# Patient Record
Sex: Female | Born: 1965 | Hispanic: Yes | Marital: Married
Health system: Midwestern US, Academic
[De-identification: ages and names within clinical notes are randomized; demographics above are authoritative.]

---

## 2016-06-01 MED ORDER — GABAPENTIN 300 MG PO CAP
300 mg | ORAL_CAPSULE | Freq: Every evening | ORAL | 3 refills | Status: DC
Start: 2016-06-01 — End: 2018-02-16

## 2016-08-31 ENCOUNTER — Encounter: Admit: 2016-08-31 | Discharge: 2016-08-31 | Payer: BC Managed Care – PPO

## 2016-08-31 DIAGNOSIS — Z853 Personal history of malignant neoplasm of breast: ICD-10-CM

## 2016-08-31 DIAGNOSIS — N951 Menopausal and female climacteric states: ICD-10-CM

## 2016-08-31 DIAGNOSIS — R232 Flushing: ICD-10-CM

## 2016-08-31 DIAGNOSIS — T451X5A Adverse effect of antineoplastic and immunosuppressive drugs, initial encounter: ICD-10-CM

## 2016-08-31 DIAGNOSIS — Z9010 Acquired absence of unspecified breast and nipple: ICD-10-CM

## 2016-08-31 DIAGNOSIS — C50211 Malignant neoplasm of upper-inner quadrant of right female breast: Principal | ICD-10-CM

## 2016-08-31 DIAGNOSIS — Z79810 Long term (current) use of selective estrogen receptor modulators (SERMs): ICD-10-CM

## 2016-08-31 DIAGNOSIS — Z87891 Personal history of nicotine dependence: ICD-10-CM

## 2016-08-31 DIAGNOSIS — S93402A Sprain of unspecified ligament of left ankle, initial encounter: ICD-10-CM

## 2016-08-31 DIAGNOSIS — M2560 Stiffness of unspecified joint, not elsewhere classified: ICD-10-CM

## 2016-08-31 DIAGNOSIS — C50919 Malignant neoplasm of unspecified site of unspecified female breast: Principal | ICD-10-CM

## 2016-08-31 MED ORDER — TAMOXIFEN 20 MG PO TAB
20 mg | ORAL_TABLET | Freq: Every day | ORAL | 1 refills | Status: AC
Start: 2016-08-31 — End: 2016-11-30

## 2016-11-30 ENCOUNTER — Encounter: Admit: 2016-11-30 | Discharge: 2016-11-30 | Payer: BC Managed Care – PPO

## 2016-11-30 DIAGNOSIS — Z170 Estrogen receptor positive status [ER+]: ICD-10-CM

## 2016-11-30 DIAGNOSIS — M25572 Pain in left ankle and joints of left foot: ICD-10-CM

## 2016-11-30 DIAGNOSIS — Z853 Personal history of malignant neoplasm of breast: ICD-10-CM

## 2016-11-30 DIAGNOSIS — N951 Menopausal and female climacteric states: ICD-10-CM

## 2016-11-30 DIAGNOSIS — C50911 Malignant neoplasm of unspecified site of right female breast: Principal | ICD-10-CM

## 2016-11-30 DIAGNOSIS — Z87891 Personal history of nicotine dependence: ICD-10-CM

## 2016-11-30 DIAGNOSIS — T451X5A Adverse effect of antineoplastic and immunosuppressive drugs, initial encounter: ICD-10-CM

## 2016-11-30 DIAGNOSIS — R232 Flushing: ICD-10-CM

## 2016-11-30 DIAGNOSIS — C50919 Malignant neoplasm of unspecified site of unspecified female breast: Principal | ICD-10-CM

## 2016-11-30 DIAGNOSIS — Z79810 Long term (current) use of selective estrogen receptor modulators (SERMs): ICD-10-CM

## 2016-11-30 MED ORDER — TAMOXIFEN 20 MG PO TAB
20 mg | ORAL_TABLET | Freq: Every day | ORAL | 1 refills | Status: AC
Start: 2016-11-30 — End: 2017-05-24

## 2016-11-30 NOTE — Progress Notes
Name: Sarah Thornton          MRN: [id number]      DOB: 22-Jan-1966      AGE: 51 y.o.   DATE OF SERVICE: 11/30/2016    Subjective:             Reason for Visit:  Heme/Onc Care      Sarah Thornton is a 51 y.o. female.      History of Present Illness  REFERRING PROVIDER: Dr. Linward Foster  SURGEON: Dr. Yetta Barre  RADIATION ONCOLOGIST: Dr. Murvin Donning    DIAGNOSIS: Invasive ductal carcinoma grade 3, ER and PR positive, HER-2 negative  STAGE: pT3p N2a, stage IIIA  ONCOLOGICAL HISTORY:    Sarah Thornton is a pleasant 51 year old female who was diagnosed with right breast invasive ductal carcinoma 02/07/2014.  She presented due to a palpable right breast lump, imaging studies performed at [facility] showed 2.2 x 2.2 x 1.4 cm suspicious mass at 2 o'clock position.  Multiple cystic areas in the left breast 3:00 and 8 o'clock position were noted which were stable compared to the prior imaging studies.  She then underwent biopsy, pathology was consistent with invasive ductal carcinoma, ER 90%, PR 30% and HER-2 1+, 1 of the lymph nodes biopsied was positive.  She was evaluated by surgery and underwent modified radical mastectomy and lymph node dissection.  Surgical pathology showed invasive ductal carcinoma grade 3 measuring 5.7 x 4.6 x 2.5 cm, 4/9 lymph nodes were involved with extranodal extension, no DCIS.  She was treated by Dr. Rico Ala at [facility] her medical oncologist, she received dose dense AC followed by weekly Taxol ???12 weeks, she tolerated it extremely well.  She subsequently had postmastectomy radiation including regional lymphatics with 60 gray dose.  She was started on adjuvant endocrine therapy with tamoxifen in November 2016.  She stopped having menstrual cycles after first cycle of chemotherapy.  Has been amenorrheic since.  Patient has been tolerating tamoxifen overall well except for hot flashes. She tells me that baseline CT scans were performed after surgery due to stage III disease, but I do not have that information available for review.  She established with me due to change in her insurance plan, and inability to follow with her previous medical oncologist.      TREATMENT HISTORY:   Right MRM with axillary lymph node dissection  Adjuvant chemotherapy with dose dense before AC and Taxol  Postmastectomy radiation  Tamoxifen initiated November 2016  CURRENT THERAPY:   Tamoxifen    INTERVAL HISTORY: Patient is here for follow-up.  Unaccompanied.  Hot flashes are slowly improving.  Overall she is tolerating tamoxifen very well.  She did have injury at workplace, left ankle pain is still not resolved.  She is working with [employer] to see a Careers adviser.  Also noticing occasional pain in the right upper chest wall.         Review of Systems   Constitutional: Negative for activity change, appetite change, chills, diaphoresis and fever.        Hot flashes     HENT: Negative.    Eyes: Negative.    Respiratory: Negative for cough and shortness of breath.    Cardiovascular: Negative for chest pain and palpitations.   Gastrointestinal: Negative for abdominal pain, blood in stool, constipation, diarrhea, nausea and vomiting.   Genitourinary: Negative.    Musculoskeletal: Negative for arthralgias.   Skin: Negative for rash.   Neurological: Negative for dizziness,  weakness, light-headedness, numbness and headaches.   Hematological: Negative.    Psychiatric/Behavioral: Negative.            PAST MEDICAL HISTORY: breast cancer  PAST SURGICAL HISTORY:  Mastectomy, lymph node dissection, colonoscopy  FAMILY HISTORY: Reviewed and is noncontributory.  Mother with history of lymphoma.  No other family history of malignancies.  OB/GYN HISTORY:  Age at Menarche: 26  G2P2  LMP: age  61  HRT use: None  Breastfeeding: No  SOCIAL HISTORY: Patient is married.  Lives with her husband.  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  Former smoker, quit in 2015, 5-pack-year history, no alcohol use, no illicit drug use.      Objective:         ??? gabapentin (NEURONTIN) 300 mg capsule Take 1 capsule by mouth at bedtime daily.   ??? tamoxifen (NOLVADEX) 20 mg tablet Take one tablet by mouth daily.     Vitals:    11/30/16 0812 11/30/16 0818   BP: 118/44    Pulse: 69    Resp: 18    Temp: 36.6 ???C (97.9 ???F)    TempSrc: Oral Oral   SpO2: 98%    Weight: 66.7 kg (147 lb)    Height: 157.5 cm (62)      Body mass index is 26.89 kg/m???.     Pain Score: Zero         Pain Addressed:  N/A    Patient Evaluated for a Clinical Trial: No treatment clinical trial available for this patient.     [place] Cooperative Oncology Group performance status is 1, Restricted in physically strenuous activity but ambulatory and able to carry out work of a light or sedentary nature, e.g., light house work, office work.     Physical Exam   Constitutional: She is oriented to person, place, and time. She appears well-developed and well-nourished. No distress.   HENT:   Mouth/Throat: Oropharynx is clear and moist. No oropharyngeal exudate.   Eyes: Conjunctivae and EOM are normal.   Neck: Neck supple.   Cardiovascular: Normal rate, regular rhythm and normal heart sounds.    No murmur heard.  Pulmonary/Chest: Effort normal and breath sounds normal. She has no wheezes.       Musculoskeletal: Normal range of motion. She exhibits no tenderness.        Lymphadenopathy:     She has no cervical adenopathy.   Neurological: She is alert and oriented to person, place, and time.   Skin: No rash noted.   Psychiatric: She has a normal mood and affect. Her behavior is normal. Judgment and thought content normal.             Assessment and Plan:  1.  History of right breast invasive ductal carcinoma grade 3 ER and PR positive, HER-2 negative.  Tumor measuring more than 5 cm with 4 lymph nodes positive pT3 pN2A, stage IIIa status post mastectomy and lymph node dissection 2.  She has received adjuvant chemotherapy and radiation.  3.  On adjuvant endocrine therapy with tamoxifen since November 2016.  No clinical evidence of recurrence.  Left breast mammogram performed April 2018, was normal.  She has history of left breast cysts which were stable.  4.  Hot flashes ongoing since initiation of tamoxifen, able to improve.  On gabapentin.  5.  Pain in the right upper chest wall.  No clinical evidence of recurrence, based on her history and physical exam this is likely from either scar tissue, frozen  shoulder or tendinopathy.  Supportive measures discussed.  Could consider physical therapy if symptoms do not improve.  6.  With her personal history of breast cancer, discussed genetic counseling.  However she would not meet criteria for testing based on this alone.  Mother with history of lymphoma but otherwise no family history of  Malignancies.   Discussed option of meeting with genetic counselor.  She would like to defer at this point.  7.  Risk of lymphedema with lymph node dissection and radiation, she has no acute issues.  Continue to monitor.  8. Left ankle pain.  Not improved.  She is scheduled to see a Careers adviser.  Have educated her, with increased risk of thromboembolism with tamoxifen.  He does undergo any surgery, she needs to hold tamoxifen 1 week prior to and 2 weeks after her surgery.  Pt will call us with updates.    Return to clinic in 3 months    Parts of this note were created with voice recognition software. Please excuse any grammatical or typographical errors.

## 2017-04-07 ENCOUNTER — Encounter: Admit: 2017-04-07 | Discharge: 2017-04-07 | Payer: BC Managed Care – PPO

## 2017-04-07 DIAGNOSIS — Z87891 Personal history of nicotine dependence: ICD-10-CM

## 2017-04-07 DIAGNOSIS — C50919 Malignant neoplasm of unspecified site of unspecified female breast: Principal | ICD-10-CM

## 2017-04-07 DIAGNOSIS — R232 Flushing: Secondary | ICD-10-CM

## 2017-04-07 DIAGNOSIS — Z853 Personal history of malignant neoplasm of breast: ICD-10-CM

## 2017-04-07 DIAGNOSIS — C50211 Malignant neoplasm of upper-inner quadrant of right female breast: Principal | ICD-10-CM

## 2017-04-07 DIAGNOSIS — T451X5A Adverse effect of antineoplastic and immunosuppressive drugs, initial encounter: ICD-10-CM

## 2017-04-07 DIAGNOSIS — Z170 Estrogen receptor positive status [ER+]: ICD-10-CM

## 2017-05-23 ENCOUNTER — Encounter: Admit: 2017-05-23 | Discharge: 2017-05-23 | Payer: BC Managed Care – PPO

## 2017-05-23 DIAGNOSIS — C50911 Malignant neoplasm of unspecified site of right female breast: ICD-10-CM

## 2017-05-23 DIAGNOSIS — R232 Flushing: Principal | ICD-10-CM

## 2017-05-24 MED ORDER — TAMOXIFEN 20 MG PO TAB
ORAL_TABLET | Freq: Every day | 0 refills | Status: AC
Start: 2017-05-24 — End: 2017-08-16

## 2017-08-09 ENCOUNTER — Encounter: Admit: 2017-08-09 | Discharge: 2017-08-09 | Payer: BC Managed Care – PPO

## 2017-08-15 ENCOUNTER — Encounter: Admit: 2017-08-15 | Discharge: 2017-08-15 | Payer: BC Managed Care – PPO

## 2017-08-16 ENCOUNTER — Encounter: Admit: 2017-08-16 | Discharge: 2017-08-16 | Payer: BC Managed Care – PPO

## 2017-08-16 DIAGNOSIS — R232 Flushing: ICD-10-CM

## 2017-08-16 DIAGNOSIS — C50911 Malignant neoplasm of unspecified site of right female breast: Principal | ICD-10-CM

## 2017-08-16 DIAGNOSIS — Z87891 Personal history of nicotine dependence: ICD-10-CM

## 2017-08-16 DIAGNOSIS — R5382 Chronic fatigue, unspecified: ICD-10-CM

## 2017-08-16 DIAGNOSIS — L603 Nail dystrophy: ICD-10-CM

## 2017-08-16 DIAGNOSIS — Z853 Personal history of malignant neoplasm of breast: ICD-10-CM

## 2017-08-16 DIAGNOSIS — C50919 Malignant neoplasm of unspecified site of unspecified female breast: Principal | ICD-10-CM

## 2017-08-16 DIAGNOSIS — L659 Nonscarring hair loss, unspecified: Secondary | ICD-10-CM

## 2017-08-16 DIAGNOSIS — T451X5A Adverse effect of antineoplastic and immunosuppressive drugs, initial encounter: ICD-10-CM

## 2017-08-16 MED ORDER — TAMOXIFEN 20 MG PO TAB
20 mg | ORAL_TABLET | Freq: Every day | ORAL | 1 refills | Status: AC
Start: 2017-08-16 — End: 2018-02-16

## 2017-08-17 LAB — CBC AND DIFF
Lab: 12 g/dL (ref 12.0–15.0)
Lab: 14 % (ref 11–15)
Lab: 208 10*3/uL (ref 150–400)
Lab: 27 % (ref 24–44)
Lab: 3.9 M/UL — ABNORMAL LOW (ref 4.0–5.0)
Lab: 31 pg (ref 26–34)
Lab: 33 g/dL (ref 32.0–36.0)
Lab: 36 % (ref 36–45)
Lab: 63 % (ref 41–77)
Lab: 7 % (ref 4–12)
Lab: 7.6 10*3/uL (ref 4.5–11.0)
Lab: 9.9 FL (ref 7–11)
Lab: 93 FL (ref 80–100)

## 2017-08-17 LAB — TSH WITH FREE T4 REFLEX: Lab: 1.8 uU/mL — AB (ref 0.35–5.00)

## 2017-08-17 LAB — IRON + BINDING CAPACITY + %SAT+ FERRITIN
Lab: 13 % — ABNORMAL LOW (ref 28–42)
Lab: 402 ug/dL — ABNORMAL HIGH (ref 270–380)
Lab: 49 ng/mL — AB (ref 10–200)
Lab: 53 ug/dL (ref 50–160)

## 2017-08-19 ENCOUNTER — Encounter: Admit: 2017-08-19 | Discharge: 2017-08-19 | Payer: BC Managed Care – PPO

## 2017-09-12 ENCOUNTER — Encounter: Admit: 2017-09-12 | Discharge: 2017-09-13

## 2017-09-23 ENCOUNTER — Encounter: Admit: 2017-09-23 | Discharge: 2017-09-24

## 2018-02-16 ENCOUNTER — Encounter: Admit: 2018-02-16 | Discharge: 2018-02-16 | Payer: BC Managed Care – PPO

## 2018-02-16 DIAGNOSIS — T451X5A Adverse effect of antineoplastic and immunosuppressive drugs, initial encounter: Secondary | ICD-10-CM

## 2018-02-16 DIAGNOSIS — C50911 Malignant neoplasm of unspecified site of right female breast: Secondary | ICD-10-CM

## 2018-02-16 DIAGNOSIS — C50919 Malignant neoplasm of unspecified site of unspecified female breast: Secondary | ICD-10-CM

## 2018-02-16 DIAGNOSIS — R232 Flushing: Secondary | ICD-10-CM

## 2018-02-16 MED ORDER — TAMOXIFEN 20 MG PO TAB
20 mg | ORAL_TABLET | Freq: Every day | ORAL | 1 refills | Status: AC
Start: 2018-02-16 — End: 2018-02-24

## 2018-02-16 MED ORDER — GABAPENTIN 300 MG PO CAP
300 mg | ORAL_CAPSULE | Freq: Every evening | ORAL | 3 refills | Status: AC
Start: 2018-02-16 — End: 2018-07-27

## 2018-02-24 ENCOUNTER — Encounter: Admit: 2018-02-24 | Discharge: 2018-02-24 | Payer: BC Managed Care – PPO

## 2018-02-24 DIAGNOSIS — R232 Flushing: Secondary | ICD-10-CM

## 2018-02-24 DIAGNOSIS — C50911 Malignant neoplasm of unspecified site of right female breast: Secondary | ICD-10-CM

## 2018-02-24 MED ORDER — TAMOXIFEN 20 MG PO TAB
20 mg | ORAL_TABLET | Freq: Every day | ORAL | 1 refills | Status: AC
Start: 2018-02-24 — End: 2018-07-05

## 2018-04-04 ENCOUNTER — Encounter: Admit: 2018-04-04 | Discharge: 2018-04-04 | Payer: BC Managed Care – PPO

## 2018-07-05 ENCOUNTER — Encounter: Admit: 2018-07-05 | Discharge: 2018-07-05

## 2018-07-05 DIAGNOSIS — C50911 Malignant neoplasm of unspecified site of right female breast: Secondary | ICD-10-CM

## 2018-07-05 DIAGNOSIS — R232 Flushing: Secondary | ICD-10-CM

## 2018-07-05 MED ORDER — TAMOXIFEN 20 MG PO TAB
20 mg | ORAL_TABLET | Freq: Every day | ORAL | 1 refills | Status: DC
Start: 2018-07-05 — End: 2019-01-11

## 2018-07-27 ENCOUNTER — Encounter: Admit: 2018-07-27 | Discharge: 2018-07-27

## 2018-07-27 DIAGNOSIS — C50911 Malignant neoplasm of unspecified site of right female breast: Secondary | ICD-10-CM

## 2018-07-27 DIAGNOSIS — R232 Flushing: Secondary | ICD-10-CM

## 2018-07-27 MED ORDER — GABAPENTIN 300 MG PO CAP
ORAL_CAPSULE | Freq: Every evening | 3 refills | Status: DC
Start: 2018-07-27 — End: 2018-12-11

## 2018-08-17 ENCOUNTER — Encounter: Admit: 2018-08-17 | Discharge: 2018-08-17

## 2018-08-17 DIAGNOSIS — C50919 Malignant neoplasm of unspecified site of unspecified female breast: Secondary | ICD-10-CM

## 2018-08-17 DIAGNOSIS — C50911 Malignant neoplasm of unspecified site of right female breast: Principal | ICD-10-CM

## 2018-08-17 DIAGNOSIS — R232 Flushing: Secondary | ICD-10-CM

## 2018-08-17 NOTE — Progress Notes
[facility]  Name: Sarah Thornton          MRN: [id number]      DOB: December 18, 1965      AGE: 53 y.o.   DATE OF SERVICE: 08/17/2018    Subjective:             Reason for Visit:  Heme/Onc Care      Sarah Thornton is a 53 y.o. female.      History of Present Illness  REFERRING PROVIDER: Dr. Linward Foster  SURGEON: Dr. Yetta Barre  RADIATION ONCOLOGIST: Dr. Murvin Donning    DIAGNOSIS: Invasive ductal carcinoma grade 3, ER and PR positive, HER-2 negative  STAGE: pT3p N2a, stage IIIA  ONCOLOGICAL HISTORY:    Sarah Thornton is a pleasant 53 year old female who was diagnosed with right breast invasive ductal carcinoma 02/07/2014.  She presented due to a palpable right breast lump, imaging studies performed at [facility] showed 2.2 x 2.2 x 1.4 cm suspicious mass at 2 o'clock position.  Multiple cystic areas in the left breast 3:00 and 8 o'clock position were noted which were stable compared to the prior imaging studies.  She then underwent biopsy, pathology was consistent with invasive ductal carcinoma, ER 90%, PR 30% and HER-2 1+, 1 of the lymph nodes biopsied was positive.  She was evaluated by surgery and underwent modified radical mastectomy and lymph node dissection.  Surgical pathology showed invasive ductal carcinoma grade 3 measuring 5.7 x 4.6 x 2.5 cm, 4/9 lymph nodes were involved with extranodal extension, no DCIS.  She was treated by Dr. Rico Ala at [facility] her medical oncologist, she received dose dense AC followed by weekly Taxol ???12 weeks, she tolerated it extremely well.  She subsequently had postmastectomy radiation including regional lymphatics with 60 grey dose.  She was started on adjuvant endocrine therapy with tamoxifen in November 2016.  She stopped having menstrual cycles after first cycle of chemotherapy.  Has been amenorrheic since.  Patient has been tolerating tamoxifen overall well except for hot flashes. She tells me that baseline CT scans were performed after surgery due to stage III disease, but I do not have that information available for review.  She established with me due to change in her insurance plan, and inability to follow with her previous medical oncologist.      TREATMENT HISTORY:   Right MRM with axillary lymph node dissection  Adjuvant chemotherapy with dose dense before AC and Taxol  Postmastectomy radiation  Tamoxifen initiated November 2016  CURRENT THERAPY:   Tamoxifen    INTERVAL HISTORY: Patient is here for follow-up.  Unaccompanied.  Hot Flashes are worse due to the weather, is using gabapentin at night.  Recently gabapentin was added twice daily dosing for her back and ankle pain.  Hair thinning is better with biotin.  Unfortunately she had a fall and injured her left ankle and left back in February 2020.  She is getting treated through [employer], ductions were given in the back and she feels her symptoms got worse after that.  Is trying to get a follow-up with the provider.         Review of Systems   Constitutional: Negative for activity change, appetite change, chills, diaphoresis, fatigue and fever.        Hot flashes     HENT: Negative.    Eyes: Negative.    Respiratory: Negative for cough and shortness of breath.    Cardiovascular: Negative for chest pain and palpitations.  Gastrointestinal: Negative for abdominal pain, blood in stool, constipation, diarrhea, nausea and vomiting.   Genitourinary: Negative.    Musculoskeletal: Positive for back pain. Negative for arthralgias.   Skin: Negative for rash.        Hair thinning, brittle nails   Neurological: Negative for dizziness, weakness, light-headedness, numbness and headaches.   Hematological: Negative.    Psychiatric/Behavioral: Negative.            PAST MEDICAL HISTORY: breast cancer  PAST SURGICAL HISTORY:  Mastectomy, lymph node dissection, colonoscopy  FAMILY HISTORY: Reviewed and is noncontributory.  Mother with history of lymphoma.  No other family history of malignancies.  OB/GYN HISTORY:  Age at Menarche: 36  G2P2  LMP: age  61  HRT use: None  Breastfeeding: No  SOCIAL HISTORY: Patient is married.  Lives with her husband.  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  Former smoker, quit in 2015, 5-pack-year history, no alcohol use, no illicit drug use.      Objective:         ??? gabapentin (NEURONTIN) 300 mg capsule TAKE 1 CAPSULE BY MOUTH AT BEDTIME DAILY   ??? tamoxifen (NOLVADEX) 20 mg tablet Take one tablet by mouth daily.     Vitals:    08/17/18 1143   BP: 107/51   BP Source: Arm, Left Upper   Patient Position: Sitting   Pulse: 74   Resp: 18   Temp: 36.6 ???C (97.8 ???F)   TempSrc: Oral   SpO2: 98%   Weight: 68.5 kg (151 lb)   Height: 157.5 cm (62)   PainSc: Zero     Body mass index is 27.62 kg/m???.     Pain Score: Zero         Pain Addressed:  N/A    Patient Evaluated for a Clinical Trial: No treatment clinical trial available for this patient.     [place] Cooperative Oncology Group performance status is 1, Restricted in physically strenuous activity but ambulatory and able to carry out work of a light or sedentary nature, e.g., light house work, office work.     Physical Exam  Constitutional:       General: She is not in acute distress.     Appearance: She is well-developed.   HENT:      Mouth/Throat:      Pharynx: No oropharyngeal exudate.   Eyes:      Conjunctiva/sclera: Conjunctivae normal.   Neck:      Musculoskeletal: Neck supple.   Cardiovascular:      Rate and Rhythm: Normal rate and regular rhythm.      Heart sounds: Normal heart sounds. No murmur.   Pulmonary:      Effort: Pulmonary effort is normal.      Breath sounds: Normal breath sounds. No wheezing.   Chest:       Musculoskeletal: Normal range of motion.         General: No tenderness.      Comments:      Lymphadenopathy:      Cervical: No cervical adenopathy.   Skin:     Findings: No rash.   Neurological: Mental Status: She is alert and oriented to person, place, and time.   Psychiatric:         Behavior: Behavior normal.         Thought Content: Thought content normal.         Judgment: Judgment normal.  Assessment and Plan:  1.  History of right breast invasive ductal carcinoma grade 3 ER and PR positive, HER-2 negative.  Tumor measuring more than 5 cm with 4 lymph nodes positive pT3 pN2A, stage IIIa status post mastectomy and lymph node dissection    She has received adjuvant chemotherapy and radiation.  2..  On adjuvant endocrine therapy with tamoxifen since November 2016.    Discussed prolonged endocrine therapy considering her clinical risk factors, she is postmenopausal.  Continue tamoxifen for 5 years followed by AI.  3. No clinical evidence of recurrence.    Diag mammo of left breast ordered.   4.  Hot flashes, worse likely from the weather.  She is on gabapentin twice daily.  5.  Brittle nails, hair thinning and fatigue. No acute issues  normal TSH.   biotin has helped.   6.  With her personal history of breast cancer, discussed genetic counseling.  However she would not meet criteria for testing based on this alone.  Mother with history of lymphoma but otherwise no family history of  Malignancies.   Discussed option of meeting with genetic counselor.  She would like to defer at this point.  7.  Risk of lymphedema with lymph node dissection and radiation, she has no acute issues.  Continue to monitor.    Return to clinic in 6 months or sooner as needed    Parts of this note were created with voice recognition software. Please excuse any grammatical or typographical errors.

## 2018-08-28 ENCOUNTER — Encounter: Admit: 2018-08-28 | Discharge: 2018-08-28

## 2018-08-28 DIAGNOSIS — C50911 Malignant neoplasm of unspecified site of right female breast: Secondary | ICD-10-CM

## 2018-08-28 DIAGNOSIS — R928 Other abnormal and inconclusive findings on diagnostic imaging of breast: Principal | ICD-10-CM

## 2018-08-28 NOTE — Progress Notes
Received call from [facility] that patient had left breast mammogram done and was abnormal.  Radiologist requesting orders for a cyst aspiration with US guided biopsy.  Orders placed and faxed.

## 2018-08-31 ENCOUNTER — Encounter: Admit: 2018-08-31 | Discharge: 2018-08-31

## 2018-08-31 DIAGNOSIS — R928 Other abnormal and inconclusive findings on diagnostic imaging of breast: Secondary | ICD-10-CM

## 2018-10-05 ENCOUNTER — Encounter: Admit: 2018-10-05 | Discharge: 2018-10-05

## 2018-11-03 ENCOUNTER — Encounter: Admit: 2018-11-03 | Discharge: 2018-11-03 | Payer: BC Managed Care – PPO

## 2018-11-03 NOTE — Telephone Encounter
Pre Visit Planning- New Patient    Records received: Yes    Orders have been NA    Patient active in MyChart. No appointment reminder sent. .     Patient notified of upcoming appointment.    Updated chart: Medication, History and Allergies    MRI PACS

## 2018-11-05 NOTE — Patient Instructions
Thank you for visiting [place] at the [facility] at [facility] of [facility].    Your time is important and if you had to wait today, we do apologize. Our goal is to be prompt; however, on occasion, we experience delays during clinic due to unexpected patient needs. We thank you for your patience.    Today you saw Dr. Mare Ferrari for evaluation and treatment of a worker's compensation injury. Any medication, testing, or treatment discussed during today's visit must be approved by your Work Comp Carrier. Please contact our office at [phone] if you have not been contacted for scheduling of these items within 10 business days.     In the case a potential surgery was discussed, and you wish to proceed, you must inform our office as soon as possible so that we may request written authorization on your behalf.     Please be sure to obtain a copy of your Work Status Form/Letter prior to leaving your visit today. You should expect to receive an updated copy of your Work Status Form/Letter with every appointment you attend related to your worker's compensation injury. Be sure to review this form/letter and ask any questions you may have. Please note that any work restrictions will be updated or changed only during clinical appointments with Dr. Azucena Cecil unless previously discussed with the physician.     We are happy that you came to visit with [place] today, and look forward to working with you.     General Instructions:  ? How to reach me: Please send a MyChart message or leave a voicemail for Madelaine Bhat, RN at [phone].  ? Scheduling: Our scheduling phone number is [phone].  ? How to receive your test results: If you have signed up for MyChart, results are typically viewable within 48 hours. Please note that imaging results will be reviewed during a follow-up visit.  ? How to get a medication refill: Please use MyChart Refill request or contact your pharmacy directly to request medication refills. We require 2 business days notice for refills of controlled substances.  ? Appointment Reminders on your cell phone: Make sure we have your cell phone number, and Text [place] to [phone].  ? Support for many chronic illnesses is available through [employer]: [email or web address] or [phone].  ? For urgent questions on nights, weekends or holidays, call the Operator at [phone], and ask for the doctor on call for Orthopedic Spine Surgery.    Fredric Mare RN, BSN    Clinical Nurse Coordinator ? Dr. Mare Ferrari, M.D.  The [facility]  [place], [place] [address]  Phone: [phone]

## 2018-11-07 ENCOUNTER — Ambulatory Visit: Admit: 2018-11-07 | Discharge: 2018-11-07 | Payer: BC Managed Care – PPO

## 2018-11-07 ENCOUNTER — Encounter: Admit: 2018-11-07 | Discharge: 2018-11-07 | Payer: BC Managed Care – PPO

## 2018-11-07 NOTE — Progress Notes
SPINE CENTER HISTORY AND PHYSICAL    Chief Complaint   Patient presents with   ? Lower Back - Pain, Buttocks pain, Leg Pain   ? Middle Back - Pain           Dictation on: 11/07/2018  3:17 PM by: Lillia Pauls      Dictation on: 11/07/2018  3:36 PM by: Charlesetta Garibaldi     I personally performed the key portions of the E/M visit, discussed case with resident/PA and concur with resident/PA documentation of history, physical exam, assessment, and treatment plan unless otherwise noted.        Medical History:   Diagnosis Date   ? Breast cancer (HCC)    ? Joint pain 2018   ? Other malignant neoplasm without specification of site 03/25/2013    Breast cancer     Surgical History:   Procedure Laterality Date   ? COLONOSCOPY     ? LYMPH NODE BIOPSY     ? PORTACATH PLACEMENT       No Known Allergies    Current Outpatient Medications:   ?  gabapentin (NEURONTIN) 300 mg capsule, TAKE 1 CAPSULE BY MOUTH AT BEDTIME DAILY, Disp: 30 capsule, Rfl: 3  ?  tamoxifen (NOLVADEX) 20 mg tablet, Take one tablet by mouth daily., Disp: 90 tablet, Rfl: 1  family history includes Arthritis-osteo in her mother; Cancer in her mother; Diabetes in her brother, maternal aunt, mother, and sister; Heart Disease in her mother; Stroke in her mother.  Social History     Socioeconomic History   ? Marital status: Married     Spouse name: Not on file   ? Number of children: Not on file   ? Years of education: Not on file   ? Highest education level: Not on file   Occupational History   ? Not on file   Tobacco Use   ? Smoking status: Former Smoker     Packs/day: 0.25     Years: 10.00     Pack years: 2.50     Types: Cigarettes     Last attempt to quit: 06/01/2013     Years since quitting: 5.4   ? Smokeless tobacco: Never Used   ? Tobacco comment: None   Substance and Sexual Activity   ? Alcohol use: No   ? Drug use: No   ? Sexual activity: Yes     Partners: Male     Birth control/protection: None   Other Topics Concern   ? Not on file Social History Narrative   ? Not on file     Review of Systems   Constitutional: Positive for activity change, appetite change, chills and fatigue.   HENT: Negative.    Eyes: Negative.    Respiratory: Negative.    Cardiovascular: Negative.    Gastrointestinal: Negative.    Endocrine: Negative.    Genitourinary: Negative.    Musculoskeletal: Positive for arthralgias, back pain and gait problem.   Skin: Negative.    Allergic/Immunologic: Negative.    Hematological: Negative.    Psychiatric/Behavioral: Negative.      Vitals:    11/07/18 1449   BP: 132/51   Pulse: 99   Temp: 36.8 ?C (98.2 ?F)   SpO2: 98%   Weight: 67.6 kg (149 lb)   Height: 157.5 cm (62)   PainSc: Eight     Oswestry Total Score:: 82  Pain Score: Eight  Body mass index is 27.25 kg/m?Marland Kitchen

## 2018-12-11 ENCOUNTER — Encounter: Admit: 2018-12-11 | Discharge: 2018-12-11 | Payer: BC Managed Care – PPO

## 2018-12-11 DIAGNOSIS — C50911 Malignant neoplasm of unspecified site of right female breast: Secondary | ICD-10-CM

## 2018-12-11 DIAGNOSIS — R232 Flushing: Secondary | ICD-10-CM

## 2018-12-11 MED ORDER — GABAPENTIN 300 MG PO CAP
ORAL_CAPSULE | Freq: Every evening | 3 refills | Status: DC
Start: 2018-12-11 — End: 2019-04-23

## 2018-12-11 NOTE — Patient Instructions
Thank you for visiting [place] at the [facility] at [facility] of [facility].    Your time is important and if you had to wait today, we do apologize. Our goal is to be prompt; however, on occasion, we experience delays during clinic due to unexpected patient needs. We thank you for your patience.    Today you saw Dr. Mare Ferrari for evaluation and treatment of a worker's compensation injury. Any medication, testing, or treatment discussed during today's visit must be approved by your Work Comp Carrier. Please contact our office at [phone] if you have not been contacted for scheduling of these items within 10 business days.     In the case a potential surgery was discussed, and you wish to proceed, you must inform our office as soon as possible so that we may request written authorization on your behalf.     Please be sure to obtain a copy of your Work Status Form/Letter prior to leaving your visit today. You should expect to receive an updated copy of your Work Status Form/Letter with every appointment you attend related to your worker's compensation injury. Be sure to review this form/letter and ask any questions you may have. Please note that any work restrictions will be updated or changed only during clinical appointments with Dr. Azucena Cecil unless previously discussed with the physician.     We are happy that you came to visit with [place] today, and look forward to working with you.     General Instructions:  ? How to reach me: Please send a MyChart message or leave a voicemail for Madelaine Bhat, RN at [phone].  ? Scheduling: Our scheduling phone number is [phone].  ? How to receive your test results: If you have signed up for MyChart, results are typically viewable within 48 hours. Please note that imaging results will be reviewed during a follow-up visit. ? How to get a medication refill: Please use MyChart Refill request or contact your pharmacy directly to request medication refills. We require 2 business days notice for refills of controlled substances.  ? Appointment Reminders on your cell phone: Make sure we have your cell phone number, and Text [place] to [phone].  ? Support for many chronic illnesses is available through [employer]: [email or web address][employer]no or [phone].  ? For urgent questions on nights, weekends or holidays, call the Operator at [phone], and ask for the doctor on call for Orthopedic Spine Surgery.    Fredric Mare RN, BSN    Clinical Nurse Coordinator ? Dr. Mare Ferrari, M.D.  The [facility]  [place], [place] [address]  Phone: [phone]

## 2018-12-19 ENCOUNTER — Ambulatory Visit: Admit: 2018-12-19 | Discharge: 2018-12-20 | Payer: Worker's Compensation

## 2018-12-19 ENCOUNTER — Encounter: Admit: 2018-12-19 | Discharge: 2018-12-19

## 2018-12-19 DIAGNOSIS — M549 Dorsalgia, unspecified: Secondary | ICD-10-CM

## 2018-12-19 DIAGNOSIS — C801 Malignant (primary) neoplasm, unspecified: Secondary | ICD-10-CM

## 2018-12-19 DIAGNOSIS — C50919 Malignant neoplasm of unspecified site of unspecified female breast: Secondary | ICD-10-CM

## 2018-12-19 DIAGNOSIS — M2550 Pain in unspecified joint: Secondary | ICD-10-CM

## 2018-12-19 MED ORDER — CYCLOBENZAPRINE 10 MG PO TAB
10 mg | ORAL_TABLET | Freq: Three times a day (TID) | ORAL | 0 refills | 21.00000 days | Status: AC | PRN
Start: 2018-12-19 — End: ?

## 2018-12-19 NOTE — Progress Notes
[place] NOTE       Obtained patient's, or patient proxy's, verbal consent to treat them and their agreement to [place] and NPP via this telehealth visit during the [facility] Emergency   Dictation on: 12/19/2018  3:34 PM by: Patrcia Dolly [DBURTON]          I   Review of Systems   Constitutional: Negative.  Negative for activity change, appetite change, chills, diaphoresis, fatigue, fever and unexpected weight change.   HENT: Negative.    Eyes: Negative.    Respiratory: Negative.    Cardiovascular: Negative.    Gastrointestinal: Negative.    Endocrine: Negative.    Genitourinary: Negative.    Musculoskeletal: Positive for back pain.   Skin: Negative.    Allergic/Immunologic: Negative.    Neurological: Negative.    Hematological: Negative.    Psychiatric/Behavioral: Negative.      Current Outpatient Medications on File Prior to Visit   Medication Sig Dispense Refill    gabapentin (NEURONTIN) 300 mg capsule TAKE 1 CAPSULE BY MOUTH AT BEDTIME DAILY 30 capsule 3    tamoxifen (NOLVADEX) 20 mg tablet Take one tablet by mouth daily. 90 tablet 1     No current facility-administered medications on file prior to visit.      No Known Allergies  Vitals:    12/19/18 1507   Weight: 67.6 kg (149 lb)   Height: 157.5 cm (62")   PainSc: Nine     Oswestry Total Score:: 76  Pain Score: Nine  Body mass index is 27.25 kg/m.

## 2019-01-02 ENCOUNTER — Encounter: Admit: 2019-01-02 | Discharge: 2019-01-02

## 2019-01-02 ENCOUNTER — Ambulatory Visit: Admit: 2019-01-02 | Discharge: 2019-01-02 | Payer: Worker's Compensation

## 2019-01-02 DIAGNOSIS — M48062 Spinal stenosis, lumbar region with neurogenic claudication: Secondary | ICD-10-CM

## 2019-01-02 DIAGNOSIS — M2550 Pain in unspecified joint: Secondary | ICD-10-CM

## 2019-01-02 DIAGNOSIS — C801 Malignant (primary) neoplasm, unspecified: Secondary | ICD-10-CM

## 2019-01-02 DIAGNOSIS — C50919 Malignant neoplasm of unspecified site of unspecified female breast: Secondary | ICD-10-CM

## 2019-01-02 NOTE — Progress Notes
Per [employer], orders, and referrals from 01/02/2019 visit forwarded via secure e-mail to Varney Baas RN w/ [facility] along with written surgery auth request.

## 2019-01-02 NOTE — Progress Notes
Surgery Scheduling Form: Lumbar Laminectomy    Name: Sarah Thornton MRN: [id number] DOB: 1965-02-26 Sex: female     Patient Type: EXT REC    Procedure: LUMBAR 4-5 LAMINECTOMY    Length: 60 minutes    CPT Codes: R430626    Dx:   LUMBAR STENOSIS WITH NEUROGENIC CLAUDICATION M48.062    Workup Needed: WORK COMP, STANDARD      Positioning: PRONE    Equipment: STANDARD      Procedure, codes, and equipment verified: Yes

## 2019-01-02 NOTE — Patient Instructions
Thank you for visiting [place] at the [facility] at [facility].    Your time is important and if you had to wait today, we do apologize. Our goal is to be prompt; however, on occasion, we experience delays during clinic due to unexpected patient needs. We thank you for your patience.    Today you saw Dr. Mare Ferrari for evaluation and treatment of a worker's compensation injury. Any medication, testing, or treatment discussed during today's visit must be approved by your Work Comp Carrier. Please contact our office at [phone] if you have not been contacted for scheduling of these items within 10 business days. Your Work Comp carrier is responsible for coordinating and scheduling appointments outside the [place] [facility] [employer].     In the case a potential surgery was discussed, and you wish to proceed, you must inform our office as soon as possible so that we may request written authorization on your behalf.     Please be sure to obtain a copy of your Work Status Form/Letter prior to leaving your visit today. You should expect to receive an updated copy of your Work Status Form/Letter with every appointment you attend related to your worker's compensation injury. Be sure to review this form/letter and ask any questions you may have. Please note that any work restrictions will be updated or changed only during clinical appointments with Dr. Azucena Cecil unless previously discussed with the physician.     We are happy that you came to visit with [place] today, and look forward to working with you.     General Instructions:  ? How to reach me: Please send a MyChart message or leave a voicemail for Madelaine Bhat, RN at [phone].  ? Scheduling: Our scheduling phone number is [phone]. ? How to receive your test results: If you have signed up for MyChart, results are typically viewable within 48 hours. Please note that imaging results will be reviewed during a follow-up visit.  ? How to get a medication refill: Please use MyChart Refill request or contact your pharmacy directly to request medication refills. We require 2 business days notice for refills of controlled substances.  ? Appointment Reminders on your cell phone: Make sure we have your cell phone number, and Text [place] to [phone].  ? Support for many chronic illnesses is available through [place]: [employer] or [phone].  ? For urgent questions on nights, weekends or holidays, call the Operator at [phone], and ask for the doctor on call for Orthopedic Spine Surgery.    Fredric Mare RN, BSN    Clinical Nurse Coordinator ? Dr. Mare Ferrari, M.D.  The [facility]  [place], [place] [address]  Phone: [phone]

## 2019-01-11 ENCOUNTER — Encounter: Admit: 2019-01-11 | Discharge: 2019-01-11

## 2019-01-11 DIAGNOSIS — R232 Flushing: Secondary | ICD-10-CM

## 2019-01-11 DIAGNOSIS — C50911 Malignant neoplasm of unspecified site of right female breast: Secondary | ICD-10-CM

## 2019-01-11 MED ORDER — TAMOXIFEN 20 MG PO TAB
ORAL_TABLET | Freq: Every day | 1 refills | Status: DC
Start: 2019-01-11 — End: 2019-07-30

## 2019-01-15 ENCOUNTER — Encounter: Admit: 2019-01-15 | Discharge: 2019-01-15

## 2019-02-13 ENCOUNTER — Encounter: Admit: 2019-02-13 | Discharge: 2019-02-13 | Payer: BC Managed Care – PPO

## 2019-02-15 ENCOUNTER — Encounter: Admit: 2019-02-15 | Discharge: 2019-02-15 | Payer: BC Managed Care – PPO

## 2019-02-15 DIAGNOSIS — M2550 Pain in unspecified joint: Secondary | ICD-10-CM

## 2019-02-15 DIAGNOSIS — C801 Malignant (primary) neoplasm, unspecified: Secondary | ICD-10-CM

## 2019-02-15 DIAGNOSIS — C50919 Malignant neoplasm of unspecified site of unspecified female breast: Secondary | ICD-10-CM

## 2019-02-15 DIAGNOSIS — C50911 Malignant neoplasm of unspecified site of right female breast: Secondary | ICD-10-CM

## 2019-02-15 NOTE — Progress Notes
[facility]  Name: Sarah Thornton          MRN: [id number]      DOB: 07/12/1965      AGE: 54 y.o.   DATE OF SERVICE: 02/15/2019    Subjective:             Reason for Visit:  Heme/Onc Care      Sarah Thornton is a 54 y.o. female.      History of Present Illness  REFERRING PROVIDER: Dr. Linward Foster  SURGEON: Dr. Yetta Barre  RADIATION ONCOLOGIST: Dr. Murvin Donning    DIAGNOSIS: Invasive ductal carcinoma grade 3, ER and PR positive, HER-2 negative  STAGE: pT3p N2a, stage IIIA  ONCOLOGICAL HISTORY:    Sarah Thornton is a pleasant 54 year old female who was diagnosed with right breast invasive ductal carcinoma 02/07/2014.  She presented due to a palpable right breast lump, imaging studies performed at [facility] showed 2.2 x 2.2 x 1.4 cm suspicious mass at 2 o'clock position.  Multiple cystic areas in the left breast 3:00 and 8 o'clock position were noted which were stable compared to the prior imaging studies.  She then underwent biopsy, pathology was consistent with invasive ductal carcinoma, ER 90%, PR 30% and HER-2 1+, 1 of the lymph nodes biopsied was positive.  She was evaluated by surgery and underwent modified radical mastectomy and lymph node dissection.  Surgical pathology showed invasive ductal carcinoma grade 3 measuring 5.7 x 4.6 x 2.5 cm, 4/9 lymph nodes were involved with extranodal extension, no DCIS.  She was treated by Dr. Rico Ala at [facility] her medical oncologist, she received dose dense AC followed by weekly Taxol ?12 weeks, she tolerated it extremely well.  She subsequently had postmastectomy radiation including regional lymphatics with 60 grey dose.  She was started on adjuvant endocrine therapy with tamoxifen in November 2016.  She stopped having menstrual cycles after first cycle of chemotherapy.  Has been amenorrheic since.  Patient has been tolerating tamoxifen overall well except for hot flashes. She tells me that baseline CT scans were performed after surgery due to stage III disease, but I do not have that information available for review.  She established with me due to change in her insurance plan, and inability to follow with her previous medical oncologist.      TREATMENT HISTORY:   Right MRM with axillary lymph node dissection  Adjuvant chemotherapy with dose dense before AC and Taxol  Postmastectomy radiation  Tamoxifen initiated November 2016  CURRENT THERAPY:   Tamoxifen    INTERVAL HISTORY: Patient is seen in follow up, telephone visit  Unfortunately she had a fall and injured her left ankle and left back in February 2020.  Ongoing back pain. Likely will need surgery.   Tolerating tamoxifen well  Denies vaginal bleeding     Obtained patient's verbal consent to treat them and their agreement to [facility] financial policy and NPP via this telehealth visit during the [facility] Emergency           Review of Systems   Constitutional: Negative for activity change, appetite change, chills, diaphoresis, fatigue and fever.             HENT: Negative.    Eyes: Negative.    Respiratory: Negative for cough and shortness of breath.    Cardiovascular: Negative for chest pain and palpitations.   Gastrointestinal: Negative for abdominal pain, blood in stool, constipation, diarrhea, nausea and vomiting.   Genitourinary: Negative.  Musculoskeletal: Positive for back pain. Negative for arthralgias.   Skin: Negative for rash.        Hair thinning, brittle nails   Neurological: Negative for dizziness, weakness, light-headedness, numbness and headaches.   Hematological: Negative.    Psychiatric/Behavioral: Negative.            PAST MEDICAL HISTORY: breast cancer  PAST SURGICAL HISTORY:  Mastectomy, lymph node dissection, colonoscopy  FAMILY HISTORY: Reviewed and is noncontributory.  Mother with history of lymphoma.  No other family history of malignancies.  OB/GYN HISTORY:  Age at Menarche: 30  G2P2 LMP: age  13  HRT use: None  Breastfeeding: No  SOCIAL HISTORY: Patient is married.  Lives with her husband.  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  Former smoker, quit in 2015, 5-pack-year history, no alcohol use, no illicit drug use.      Objective:         ? cyclobenzaprine (FLEXERIL) 10 mg tablet Take one tablet by mouth three times daily as needed for Muscle Cramps.   ? gabapentin (NEURONTIN) 300 mg capsule TAKE 1 CAPSULE BY MOUTH AT BEDTIME DAILY   ? tamoxifen (NOLVADEX) 20 mg tablet TAKE 1 TABLET BY MOUTH EVERY DAY     Vitals:    02/15/19 1025   PainSc: Zero     There is no height or weight on file to calculate BMI.     Pain Score: Zero         Pain Addressed:  N/A    Patient Evaluated for a Clinical Trial: No treatment clinical trial available for this patient.     [place] Cooperative Oncology Group performance status is 1, Restricted in physically strenuous activity but ambulatory and able to carry out work of a light or sedentary nature, e.g., light house work, office work.     Physical Exam  Constitutional:       General: She is not in acute distress.  Neurological:      Mental Status: She is alert and oriented to person, place, and time.   Psychiatric:         Mood and Affect: Mood normal.         Thought Content: Thought content normal.         Judgment: Judgment normal.               Assessment and Plan:  1.  History of right breast invasive ductal carcinoma grade 3 ER and PR positive, HER-2 negative.  Tumor measuring more than 5 cm with 4 lymph nodes positive pT3 pN2A, stage IIIa status post mastectomy and lymph node dissection    She has received adjuvant chemotherapy and radiation.  2..  On adjuvant endocrine therapy with tamoxifen since November 2016.    Discussed prolonged endocrine therapy considering her clinical risk factors, she is postmenopausal.  Continue tamoxifen for 5 years followed by AI. Switch to AI in Nov 2022.   Tolerating tamoxifen well. No vaginal bleeding. 3. Right breast imaging July 2020, showed abnormality c/w cyst.  cyst aspiration July 2020. Needs repeat imaging in July 2021  4.  Hot flashes, no acute issues.  She is on gabapentin twice daily.  5.  Brittle nails, hair thinning and fatigue. normal TSH. On Biotin    biotin has helped.   6.  With her personal history of breast cancer, discussed genetic counseling.  However she would not meet criteria for testing based on this alone.  Mother with history of lymphoma but otherwise no  family history of  Malignancies.   Discussed option of meeting with genetic counselor.  She would like to defer at this point.  7.  Risk of lymphedema with lymph node dissection and radiation, she has no acute issues.  Continue to monitor.  8.  Patient may need surgery for her back in the near future.  I have educated her again about DVT risk factors the tamoxifen, patient will call us if she is going to pursue surgery.  Plan would be to hold tamoxifen 1 week before and 2 weeks after the back surgery.  Postprocedure tamoxifen to be held longer if that is decreased ambulation or complications.    Return to clinic in 6 months or sooner as needed    Parts of this note were created with voice recognition software. Please excuse any grammatical or typographical errors.

## 2019-03-28 ENCOUNTER — Encounter: Admit: 2019-03-28 | Discharge: 2019-03-28 | Payer: BC Managed Care – PPO

## 2019-03-29 ENCOUNTER — Encounter: Admit: 2019-03-29 | Discharge: 2019-03-29 | Payer: BC Managed Care – PPO

## 2019-03-29 NOTE — Telephone Encounter
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  Call to her supervisor Beth placed, request auth for surgery.  Claim will be reviewed, she will call us back to let us know if surgery is approved.

## 2019-04-23 ENCOUNTER — Encounter: Admit: 2019-04-23 | Discharge: 2019-04-23 | Payer: BC Managed Care – PPO

## 2019-04-23 DIAGNOSIS — C50911 Malignant neoplasm of unspecified site of right female breast: Secondary | ICD-10-CM

## 2019-04-23 DIAGNOSIS — R232 Flushing: Secondary | ICD-10-CM

## 2019-04-23 MED ORDER — GABAPENTIN 300 MG PO CAP
ORAL_CAPSULE | Freq: Every day | 3 refills | Status: DC
Start: 2019-04-23 — End: 2019-08-28

## 2019-07-29 ENCOUNTER — Encounter: Admit: 2019-07-29 | Discharge: 2019-07-29 | Payer: BC Managed Care – PPO

## 2019-07-29 DIAGNOSIS — C50911 Malignant neoplasm of unspecified site of right female breast: Secondary | ICD-10-CM

## 2019-07-29 DIAGNOSIS — R232 Flushing: Secondary | ICD-10-CM

## 2019-07-30 MED ORDER — TAMOXIFEN 20 MG PO TAB
ORAL_TABLET | Freq: Every day | 1 refills | Status: DC
Start: 2019-07-30 — End: 2019-08-21

## 2019-08-17 ENCOUNTER — Encounter: Admit: 2019-08-17 | Discharge: 2019-08-17 | Payer: BC Managed Care – PPO

## 2019-08-17 DIAGNOSIS — C50911 Malignant neoplasm of unspecified site of right female breast: Secondary | ICD-10-CM

## 2019-08-21 ENCOUNTER — Encounter: Admit: 2019-08-21 | Discharge: 2019-08-21 | Payer: BC Managed Care – PPO

## 2019-08-21 DIAGNOSIS — M2550 Pain in unspecified joint: Secondary | ICD-10-CM

## 2019-08-21 DIAGNOSIS — C801 Malignant (primary) neoplasm, unspecified: Secondary | ICD-10-CM

## 2019-08-21 DIAGNOSIS — C50919 Malignant neoplasm of unspecified site of unspecified female breast: Secondary | ICD-10-CM

## 2019-08-21 DIAGNOSIS — C50911 Malignant neoplasm of unspecified site of right female breast: Secondary | ICD-10-CM

## 2019-08-21 MED ORDER — ANASTROZOLE 1 MG PO TAB
1 mg | ORAL_TABLET | Freq: Every day | ORAL | 1 refills | 33.00000 days | Status: AC
Start: 2019-08-21 — End: ?

## 2019-08-21 NOTE — Progress Notes
[facility]  Name: Sarah Thornton          MRN: [id number]      DOB: 1965/04/30      AGE: 54 y.o.   DATE OF SERVICE: 08/21/2019    Subjective:             Reason for Visit:  Heme/Onc Care      Sarah Thornton is a 54 y.o. female.      History of Present Illness  REFERRING PROVIDER: Dr. Linward Foster  SURGEON: Dr. Yetta Barre  RADIATION ONCOLOGIST: Dr. Murvin Donning    DIAGNOSIS: Invasive ductal carcinoma grade 3, ER and PR positive, HER-2 negative  STAGE: pT3p N2a, stage IIIA  ONCOLOGICAL HISTORY:    Sarah Thornton is a pleasant 54 year old female who was diagnosed with right breast invasive ductal carcinoma 02/07/2014.  She presented due to a palpable right breast lump, imaging studies performed at [facility] showed 2.2 x 2.2 x 1.4 cm suspicious mass at 2 o'clock position.  Multiple cystic areas in the left breast 3:00 and 8 o'clock position were noted which were stable compared to the prior imaging studies.  She then underwent biopsy, pathology was consistent with invasive ductal carcinoma, ER 90%, PR 30% and HER-2 1+, 1 of the lymph nodes biopsied was positive.  She was evaluated by surgery and underwent modified radical mastectomy and lymph node dissection.  Surgical pathology showed invasive ductal carcinoma grade 3 measuring 5.7 x 4.6 x 2.5 cm, 4/9 lymph nodes were involved with extranodal extension, no DCIS.  She was treated by Dr. Rico Ala at [facility] her medical oncologist, she received dose dense AC followed by weekly Taxol ?12 weeks, she tolerated it extremely well.  She subsequently had postmastectomy radiation including regional lymphatics with 60 grey dose.  She was started on adjuvant endocrine therapy with tamoxifen in November 2016.  She stopped having menstrual cycles after first cycle of chemotherapy.  Has been amenorrheic since.  Patient has been tolerating tamoxifen overall well except for hot flashes.  She tells me that baseline CT scans were performed after surgery due to stage III disease, but I do not have that information available for review.  She established with me due to change in her insurance plan, and inability to follow with her previous medical oncologist.      TREATMENT HISTORY:   Right MRM with axillary lymph node dissection  Adjuvant chemotherapy with dose dense before AC and Taxol  Postmastectomy radiation  Tamoxifen initiated November 2016  CURRENT THERAPY:   Tamoxifen    INTERVAL HISTORY: Patient seen in follow-up.  Unaccompanied for today's visit.  Ongoing back issues.  Has completed consultation with orthopedics Dr. Elige Radon, they are awaiting review and release from [facility] Compensation before she goes through injections versus surgery for her back.  She is also having ongoing issues with her left ankle since her injury.  Tolerating tamoxifen overall well.  No vaginal bleeding.             Review of Systems   Constitutional: Negative for activity change, appetite change, chills, diaphoresis, fatigue and fever.             HENT: Negative.    Eyes: Negative.    Respiratory: Negative for cough and shortness of breath.    Cardiovascular: Negative for chest pain and palpitations.   Gastrointestinal: Negative for abdominal pain, blood in stool, constipation, diarrhea, nausea and vomiting.   Genitourinary: Negative.    Musculoskeletal: Positive for  back pain. Negative for arthralgias.   Skin: Negative for rash.   Neurological: Negative for dizziness, weakness, light-headedness, numbness and headaches.   Hematological: Negative.    Psychiatric/Behavioral: Negative.            PAST MEDICAL HISTORY: breast cancer  PAST SURGICAL HISTORY:  Mastectomy, lymph node dissection, colonoscopy  FAMILY HISTORY: Reviewed and is noncontributory.  Mother with history of lymphoma.  No other family history of malignancies.  OB/GYN HISTORY:  Age at Menarche: 87  G2P2  LMP: age  7  HRT use: None  Breastfeeding: No  SOCIAL HISTORY: Patient is married.  Lives with her husband. 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  Former smoker, quit in 2015, 5-pack-year history, no alcohol use, no illicit drug use.      Objective:         ? cyclobenzaprine (FLEXERIL) 10 mg tablet Take one tablet by mouth three times daily as needed for Muscle Cramps.   ? gabapentin (NEURONTIN) 300 mg capsule TAKE 1 CAPSULE BY MOUTH DAILY AT BEDTIME   ? tamoxifen (NOLVADEX) 20 mg tablet TAKE 1 TABLET BY MOUTH EVERY DAY     Vitals:    08/21/19 1059   BP: 108/52   BP Source: Arm, Left Upper   Patient Position: Sitting   Pulse: 80   Resp: 18   Temp: 36.3 ?C (97.4 ?F)   TempSrc: Temporal   SpO2: 99%   Weight: 67.1 kg (148 lb)   Height: 157.5 cm (62)   PainSc: Zero     Body mass index is 27.07 kg/m?Marland Kitchen     Pain Score: Zero         Pain Addressed:  N/A    Patient Evaluated for a Clinical Trial: No treatment clinical trial available for this patient.     [place] Cooperative Oncology Group performance status is 1, Restricted in physically strenuous activity but ambulatory and able to carry out work of a light or sedentary nature, e.g., light house work, office work.     Physical Exam  Constitutional:       General: She is not in acute distress.  HENT:      Head: Normocephalic.   Eyes:      Extraocular Movements: Extraocular movements intact.      Conjunctiva/sclera: Conjunctivae normal.   Cardiovascular:      Rate and Rhythm: Normal rate and regular rhythm.   Chest:       Musculoskeletal:         General: Normal range of motion.      Right lower leg: No edema.      Left lower leg: No edema.   Neurological:      Mental Status: She is alert and oriented to person, place, and time.   Psychiatric:         Mood and Affect: Mood normal.         Thought Content: Thought content normal.         Judgment: Judgment normal.               Assessment and Plan:  1.  History of right breast invasive ductal carcinoma grade 3 ER and PR positive, HER-2 negative.  Tumor measuring more than 5 cm with 4 lymph nodes positive pT3 pN2A, stage IIIa status post mastectomy and lymph node dissection    She has received adjuvant chemotherapy and radiation.  2..  On adjuvant endocrine therapy with tamoxifen since November 2016.    On her tumor  characteristics, prolonged endocrine therapy recommended.  She is completing 5 years of treatment with tamoxifen by November 2021.  She has 2 more month supply of tamoxifen.  So we decided to switch from tamoxifen to anastrozole after completion of that supply.  Side effects of anastrozole, difference in modality of action compared to tamoxifen also reviewed in detail.  Handouts provided.  We will see how has resolved will impact her back pain.  She has intact uterus, no issues with vaginal bleeding.  3. Right breast imaging July 2020, showed abnormality c/w cyst.  cyst aspiration July 2020.  Repeat has been scheduled.    4.  Hot flashes, no acute issues.  She is on gabapentin twice daily.  5.  Brittle nails, hair thinning and fatigue. normal TSH. On Biotin    biotin has helped.   6.  With her personal history of breast cancer, discussed genetic counseling.  However she would not meet criteria for testing based on this alone.  Mother with history of lymphoma but otherwise no family history of  Malignancies.   Discussed option of meeting with genetic counselor.  She would like to defer at this point.  7.  Risk of lymphedema with lymph node dissection and radiation, she has no acute issues.  Continue to monitor.  8.  Patient may need surgery for her back in the near future.  I have educated her again about DVT risk factors the tamoxifen, patient will call us if she is going to pursue surgery.  Plan would be to hold tamoxifen 1 week before and 2 weeks after the back surgery.  Postprocedure tamoxifen to be held longer if that is decreased ambulation or complications.  If she proceeds with surgery after she switches to AI, no preoperative interruption is needed.    Return to clinic in 4 months or sooner as needed  Parts of this note were created with voice recognition software. Please excuse any grammatical or typographical errors.

## 2019-08-28 ENCOUNTER — Encounter: Admit: 2019-08-28 | Discharge: 2019-08-28 | Payer: BC Managed Care – PPO

## 2019-08-28 DIAGNOSIS — R232 Flushing: Secondary | ICD-10-CM

## 2019-08-28 DIAGNOSIS — C50911 Malignant neoplasm of unspecified site of right female breast: Secondary | ICD-10-CM

## 2019-08-28 MED ORDER — GABAPENTIN 300 MG PO CAP
ORAL_CAPSULE | Freq: Every day | 3 refills | Status: AC
Start: 2019-08-28 — End: ?

## 2019-09-28 ENCOUNTER — Encounter: Admit: 2019-09-28 | Discharge: 2019-09-28 | Payer: BC Managed Care – PPO

## 2019-12-18 ENCOUNTER — Encounter: Admit: 2019-12-18 | Discharge: 2019-12-18 | Payer: BC Managed Care – PPO

## 2019-12-18 DIAGNOSIS — C50919 Malignant neoplasm of unspecified site of unspecified female breast: Secondary | ICD-10-CM

## 2019-12-18 DIAGNOSIS — M2550 Pain in unspecified joint: Secondary | ICD-10-CM

## 2019-12-18 DIAGNOSIS — C801 Malignant (primary) neoplasm, unspecified: Secondary | ICD-10-CM

## 2019-12-18 DIAGNOSIS — Z79811 Long term (current) use of aromatase inhibitors: Secondary | ICD-10-CM

## 2019-12-18 DIAGNOSIS — R232 Flushing: Secondary | ICD-10-CM

## 2019-12-18 DIAGNOSIS — C50911 Malignant neoplasm of unspecified site of right female breast: Secondary | ICD-10-CM

## 2019-12-18 MED ORDER — ANASTROZOLE 1 MG PO TAB
1 mg | ORAL_TABLET | Freq: Every day | ORAL | 1 refills | 33.00000 days | Status: AC
Start: 2019-12-18 — End: ?

## 2020-04-18 ENCOUNTER — Encounter: Admit: 2020-04-18 | Discharge: 2020-04-18 | Payer: BC Managed Care – PPO

## 2020-04-18 DIAGNOSIS — Z79811 Long term (current) use of aromatase inhibitors: Secondary | ICD-10-CM

## 2020-04-18 DIAGNOSIS — C50911 Malignant neoplasm of unspecified site of right female breast: Secondary | ICD-10-CM

## 2020-05-27 ENCOUNTER — Encounter: Admit: 2020-05-27 | Discharge: 2020-05-27 | Payer: BC Managed Care – PPO

## 2020-05-27 DIAGNOSIS — C50919 Malignant neoplasm of unspecified site of unspecified female breast: Secondary | ICD-10-CM

## 2020-05-27 DIAGNOSIS — C801 Malignant (primary) neoplasm, unspecified: Secondary | ICD-10-CM

## 2020-05-27 DIAGNOSIS — M2550 Pain in unspecified joint: Secondary | ICD-10-CM

## 2020-06-16 ENCOUNTER — Encounter: Admit: 2020-06-16 | Discharge: 2020-06-16 | Payer: BC Managed Care – PPO

## 2020-06-16 MED ORDER — ANASTROZOLE 1 MG PO TAB
1 mg | ORAL_TABLET | Freq: Every day | ORAL | 1 refills | 33.00000 days | Status: AC
Start: 2020-06-16 — End: ?

## 2020-09-02 ENCOUNTER — Encounter: Admit: 2020-09-02 | Discharge: 2020-09-02 | Payer: BC Managed Care – PPO

## 2020-09-16 ENCOUNTER — Encounter: Admit: 2020-09-16 | Discharge: 2020-09-16 | Payer: BC Managed Care – PPO

## 2020-09-16 MED ORDER — ANASTROZOLE 1 MG PO TAB
ORAL_TABLET | Freq: Every day | ORAL | 1 refills | 33.00000 days | Status: AC
Start: 2020-09-16 — End: ?

## 2020-12-02 ENCOUNTER — Encounter: Admit: 2020-12-02 | Discharge: 2020-12-02 | Payer: BC Managed Care – PPO

## 2020-12-02 DIAGNOSIS — C50911 Malignant neoplasm of unspecified site of right female breast: Secondary | ICD-10-CM

## 2020-12-02 DIAGNOSIS — M2550 Pain in unspecified joint: Secondary | ICD-10-CM

## 2020-12-02 DIAGNOSIS — Z79811 Long term (current) use of aromatase inhibitors: Secondary | ICD-10-CM

## 2020-12-02 DIAGNOSIS — M85852 Other specified disorders of bone density and structure, left thigh: Secondary | ICD-10-CM

## 2020-12-02 DIAGNOSIS — C801 Malignant (primary) neoplasm, unspecified: Secondary | ICD-10-CM

## 2020-12-02 DIAGNOSIS — C50919 Malignant neoplasm of unspecified site of unspecified female breast: Secondary | ICD-10-CM

## 2020-12-02 MED ORDER — ANASTROZOLE 1 MG PO TAB
1 mg | ORAL_TABLET | Freq: Every day | ORAL | 1 refills | 33.00000 days | Status: AC
Start: 2020-12-02 — End: ?

## 2020-12-02 NOTE — Progress Notes
[facility]  Name: Sarah Thornton          MRN: [id number]      DOB: 1965/08/02      AGE: 55 y.o.   DATE OF SERVICE: 12/02/2020    Subjective:             Reason for Visit:  Heme/Onc Care and Follow Up      Sarah Thornton is a 55 y.o. female.      History of Present Illness  REFERRING PROVIDER: Dr. Linward Foster  SURGEON: Dr. Yetta Barre  RADIATION ONCOLOGIST: Dr. Murvin Donning    DIAGNOSIS: Invasive ductal carcinoma grade 3, ER and PR positive, HER-2 negative  STAGE: pT3p N2a, stage IIIA  ONCOLOGICAL HISTORY:    [name] is a pleasant 55 year old female who was diagnosed with right breast invasive ductal carcinoma 02/07/2014.  She presented due to a palpable right breast lump, imaging studies performed at [facility] showed 2.2 x 2.2 x 1.4 cm suspicious mass at 2 o'clock position.  Multiple cystic areas in the left breast 3:00 and 8 o'clock position were noted which were stable compared to the prior imaging studies.  She then underwent biopsy, pathology was consistent with invasive ductal carcinoma, ER 90%, PR 30% and HER-2 1+, 1 of the lymph nodes biopsied was positive.  She was evaluated by surgery and underwent modified radical mastectomy and lymph node dissection.  Surgical pathology showed invasive ductal carcinoma grade 3 measuring 5.7 x 4.6 x 2.5 cm, 4/9 lymph nodes were involved with extranodal extension, no DCIS.  She was treated by Dr. Rico Ala at [facility] her medical oncologist, she received dose dense AC followed by weekly Taxol ?12 weeks, she tolerated it extremely well.  She subsequently had postmastectomy radiation including regional lymphatics with 60 grey dose.  She was started on adjuvant endocrine therapy with tamoxifen in November 2016.  She stopped having menstrual cycles after first cycle of chemotherapy.  Has been amenorrheic since.  Patient has been tolerating tamoxifen overall well except for hot flashes.  She tells me that baseline CT scans were performed after surgery due to stage III disease, but I do not have that information available for review.  She established with me due to change in her insurance plan, and inability to follow with her previous medical oncologist.      TREATMENT HISTORY:   Right MRM with axillary lymph node dissection  Adjuvant chemotherapy with dose dense before [facility] and Taxol  Postmastectomy radiation  Tamoxifen November 2016 -November 2021  CURRENT THERAPY:   Anastrozole    INTERVAL HISTORY: Sarah Thornton is seen in follow-up.  Unaccompanied for today's visit.  Back pain, left ankle pain have been ongoing.  This is limiting her mobility, impacting quality of life.  She has been doing well on anastrozole otherwise.  Has not seen any worsening of back pain or joint pains with the medication.  No hot flashes.  But she has not been able to exercise as she would like to because of these issues.             Review of Systems   Constitutional: Negative for activity change, appetite change, chills, diaphoresis, fatigue and fever.             HENT: Negative.    Eyes: Negative.    Respiratory: Negative for cough and shortness of breath.    Cardiovascular: Negative for chest pain and palpitations.   Gastrointestinal: Negative for abdominal pain, blood in  stool, constipation, diarrhea, nausea and vomiting.   Genitourinary: Negative.    Musculoskeletal: Positive for back pain. Negative for arthralgias.   Skin: Negative for rash.   Neurological: Negative for dizziness, weakness, light-headedness, numbness and headaches.   Hematological: Negative.    Psychiatric/Behavioral: Negative.            PAST MEDICAL HISTORY: breast cancer  PAST SURGICAL HISTORY:  Mastectomy, lymph node dissection, colonoscopy  FAMILY HISTORY: Reviewed and is noncontributory.  Mother with history of lymphoma.  No other family history of malignancies.  OB/GYN HISTORY:  Age at Menarche: 62  G2P2  LMP: age  32  HRT use: None  Breastfeeding: No  SOCIAL HISTORY: Patient is married. Lives with her husband.  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  Former smoker, quit in 2015, 5-pack-year history, no alcohol use, no illicit drug use.      Objective:         ? anastrozole (ARIMIDEX) 1 mg tablet TAKE 1 TABLET BY MOUTH DAILY   ? cyclobenzaprine (FLEXERIL) 10 mg tablet Take one tablet by mouth three times daily as needed for Muscle Cramps.   ? gabapentin (NEURONTIN) 300 mg capsule TAKE 1 CAPSULE BY MOUTH DAILY AT BEDTIME     Vitals:    12/02/20 1157   BP: 112/55   BP Source: Arm, Left Upper   Pulse: 71   Temp: 36.7 ?C (98.1 ?F)   Resp: 12   SpO2: 99%   TempSrc: Oral   PainSc: Zero   Weight: 61.7 kg (136 lb)   Height: 159 cm (5' 2.6)     Body mass index is 24.4 kg/m?Marland Kitchen     Pain Score: Zero         Pain Addressed:  N/A    Patient Evaluated for a Clinical Trial: No treatment clinical trial available for this patient.     [place] Cooperative Oncology Group performance status is 1, Restricted in physically strenuous activity but ambulatory and able to carry out work of a light or sedentary nature, e.g., light house work, office work.     Physical Exam  Constitutional:       General: She is not in acute distress.  HENT:      Head: Normocephalic.   Eyes:      Extraocular Movements: Extraocular movements intact.      Conjunctiva/sclera: Conjunctivae normal.   Cardiovascular:      Rate and Rhythm: Normal rate and regular rhythm.   Chest:       Musculoskeletal:         General: Normal range of motion.      Right lower leg: No edema.      Left lower leg: No edema.   Neurological:      Mental Status: She is alert and oriented to person, place, and time.   Psychiatric:         Mood and Affect: Mood normal.         Thought Content: Thought content normal.         Judgment: Judgment normal.               Assessment and Plan:  1.  History of right breast invasive ductal carcinoma grade 3 ER and PR positive, HER-2 negative.  Tumor measuring more than 5 cm with 4 lymph nodes positive pT3 pN2A, stage IIIa status post mastectomy and lymph node dissection  She has received adjuvant chemotherapy and radiation.  2. On adjuvant endocrine therapy since November 2016.  Tamoxifen Nov 2016 - Nov 2021.  Based on her tumor characteristics, prolonged endocrine therapy recommended.  Plan for additional 2 to 5 years based on tolerability.  On Anastrozole since July 2021.  Tolerating well.  3.  09/01/2020 left breast mammogram BI-RADS 1  4.  Hot flashes, no acute issues.  She is on gabapentin twice daily.  5.  Brittle nails, hair thinning and fatigue. normal TSH. On Biotin   Continue biotin, use scalp massages.  Hair thinning likely from endocrine therapy.   6.  With her personal history of breast cancer, discussed genetic counseling.  However she would not meet criteria for testing based on this alone.  Mother with history of lymphoma but otherwise no family history of  Malignancies.   Discussed option of meeting with genetic counselor.  She would like to defer at this point.  7.  Risk of lymphedema with lymph node dissection and radiation, she has no acute issues.  Continue to monitor.  8.   Bone health: No known history of osteoporosis.    Baseline BMD March 2022 at [facility] with least T score of -2.3 in the femur.  -1.8 in the lumbar spine.  We discussed management options. With intact uterus, previous prolonged tamoxifen use, she was not taking calcium and vitamin D supplementation ( She started ca/vit D after BMD March 2022.) shared decision made to continue anastrozole and repeat bone density in 1 year that is March 2023.    Limited weightbearing exercises due to back pain and left ankle pain.  Orders placed for repeat testing.  Patient is aware that this could be self-pay as it is in 1 year.  But she will check with her insurance first.    Return to clinic March 2023 with bone density  Parts of this note were created with voice recognition software. Please excuse any grammatical or typographical errors.

## 2021-04-07 ENCOUNTER — Encounter: Admit: 2021-04-07 | Discharge: 2021-04-07 | Payer: BC Managed Care – PPO

## 2021-04-07 DIAGNOSIS — Z79811 Long term (current) use of aromatase inhibitors: Secondary | ICD-10-CM

## 2021-04-07 DIAGNOSIS — M2550 Pain in unspecified joint: Secondary | ICD-10-CM

## 2021-04-07 DIAGNOSIS — C801 Malignant (primary) neoplasm, unspecified: Secondary | ICD-10-CM

## 2021-04-07 DIAGNOSIS — C50919 Malignant neoplasm of unspecified site of unspecified female breast: Secondary | ICD-10-CM

## 2021-04-07 DIAGNOSIS — C50911 Malignant neoplasm of unspecified site of right female breast: Secondary | ICD-10-CM

## 2021-04-07 NOTE — Progress Notes
[facility]  Name: Sarah Thornton          MRN: [id number]      DOB: [birth year]      AGE: 56 y.o.   DATE OF SERVICE: 04/07/2021    Subjective:             Reason for Visit:  Heme/Onc Care and Follow Up      Sarah Thornton is a 56 y.o. female.      History of Present Illness  REFERRING PROVIDER: Dr. Linward Foster  SURGEON: Dr. Yetta Barre  RADIATION ONCOLOGIST: Dr. Murvin Donning    DIAGNOSIS: Invasive ductal carcinoma grade 3, ER and PR positive, HER-2 negative  STAGE: pT3p N2a, stage IIIA  ONCOLOGICAL HISTORY:    Sarah Thornton is a pleasant 56 year old female who was diagnosed with right breast invasive ductal carcinoma 02/07/2014.  She presented due to a palpable right breast lump, imaging studies performed at [facility] showed 2.2 x 2.2 x 1.4 cm suspicious mass at 2 o'clock position.  Multiple cystic areas in the left breast 3:00 and 8 o'clock position were noted which were stable compared to the prior imaging studies.  She then underwent biopsy, pathology was consistent with invasive ductal carcinoma, ER 90%, PR 30% and HER-2 1+, 1 of the lymph nodes biopsied was positive.  She was evaluated by surgery and underwent modified radical mastectomy and lymph node dissection.  Surgical pathology showed invasive ductal carcinoma grade 3 measuring 5.7 x 4.6 x 2.5 cm, 4/9 lymph nodes were involved with extranodal extension, no DCIS.  She was treated by Dr. Rico Ala at [facility] her medical oncologist, she received dose dense AC followed by weekly Taxol ?12 weeks, she tolerated it extremely well.  She subsequently had postmastectomy radiation including regional lymphatics with 60 grey dose.  She was started on adjuvant endocrine therapy with tamoxifen in November 2016.  She stopped having menstrual cycles after first cycle of chemotherapy.  Has been amenorrheic since.  Patient has been tolerating tamoxifen overall well except for hot flashes.  She tells me that baseline CT scans were performed after surgery due to stage III disease, but I do not have that information available for review.  She established with me due to change in her insurance plan, and inability to follow with her previous medical oncologist.      TREATMENT HISTORY:   Right MRM with axillary lymph node dissection  Adjuvant chemotherapy with dose dense before [facility] and Taxol  Postmastectomy radiation  Tamoxifen November 2016 -November 2021  CURRENT THERAPY:   Anastrozole    INTERVAL HISTORY: Sarah Thornton is seen in follow-up.  Unaccompanied for today's visit.  She reports significant pain in her bilateral hips.  She has stiffness and difficulty when she is getting up, symptoms initially improved but then get worse after walking prolonged.  Of time.  It has been very uncomfortable.  She is struggling to maintain her quality of life.  She is not sure if it is related to her ankle, back issues or the medication.  She has been working with [employer] but has not been able to go through any surgeries yet.  All of the above is causing situational depression.           Review of Systems   Constitutional: Negative for activity change, appetite change, chills, diaphoresis, fatigue and fever.             HENT: Negative.    Eyes: Negative.  Respiratory: Negative for cough and shortness of breath.    Cardiovascular: Negative for chest pain and palpitations.   Gastrointestinal: Negative for abdominal pain, blood in stool, constipation, diarrhea, nausea and vomiting.   Genitourinary: Negative.    Musculoskeletal: Positive for arthralgias and back pain.   Skin: Negative for rash.   Neurological: Negative for dizziness, weakness, light-headedness, numbness and headaches.   Hematological: Negative.    Psychiatric/Behavioral: Negative.            PAST MEDICAL HISTORY: breast cancer  PAST SURGICAL HISTORY:  Mastectomy, lymph node dissection, colonoscopy  FAMILY HISTORY: Reviewed and is noncontributory.  Mother with history of lymphoma.  No other family history of malignancies.  OB/GYN HISTORY:  Age at Menarche: 65  G2P2  LMP: age  19  HRT use: None  Breastfeeding: No  SOCIAL HISTORY: Patient is married.  Lives with her husband.  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  Former smoker, quit in 2015, 5-pack-year history, no alcohol use, no illicit drug use.      Objective:         ? anastrozole (ARIMIDEX) 1 mg tablet Take one tablet by mouth daily.   ? cyclobenzaprine (FLEXERIL) 10 mg tablet Take one tablet by mouth three times daily as needed for Muscle Cramps.   ? gabapentin (NEURONTIN) 300 mg capsule TAKE 1 CAPSULE BY MOUTH DAILY AT BEDTIME     Vitals:    04/07/21 1103   BP: 125/47   BP Source: Arm, Left Upper   Pulse: 58   Temp: 36.4 ?C (97.6 ?F)   Resp: 10   SpO2: 100%   TempSrc: Oral   PainSc: Seven   Weight: 60 kg (132 lb 3.2 oz)   Height: 159 cm (5' 2.6)     Body mass index is 23.72 kg/m?Marland Kitchen     Pain Score: Seven  Pain Loc: Back (ankle)      Pain Addressed:  N/A    Patient Evaluated for a Clinical Trial: No treatment clinical trial available for this patient.     [place] Cooperative Oncology Group performance status is 1, Restricted in physically strenuous activity but ambulatory and able to carry out work of a light or sedentary nature, e.g., light house work, office work.     Physical Exam  Constitutional:       General: She is not in acute distress.  HENT:      Head: Normocephalic.   Eyes:      Extraocular Movements: Extraocular movements intact.      Conjunctiva/sclera: Conjunctivae normal.   Cardiovascular:      Rate and Rhythm: Normal rate and regular rhythm.   Musculoskeletal:         General: Tenderness (bilateral hip joint tenderness) present.      Right lower leg: No edema.      Left lower leg: No edema.   Neurological:      Mental Status: She is alert and oriented to person, place, and time.   Psychiatric:         Mood and Affect: Mood normal.         Thought Content: Thought content normal.         Judgment: Judgment normal. Assessment and Plan:  1.  History of right breast invasive ductal carcinoma grade 3 ER and PR positive, HER-2 negative.  Tumor measuring more than 5 cm with 4 lymph nodes positive pT3 pN2A, stage IIIa status post mastectomy and lymph node dissection  She has received adjuvant  chemotherapy and radiation.    2. On adjuvant endocrine therapy since November 2016.    Tamoxifen Nov 2016 - Nov 2021.  Based on her tumor characteristics, prolonged endocrine therapy recommended.  Plan for additional 2 to 5 years based on tolerability.  On Anastrozole since July 2021.    Bilateral hip pain.  Could be related to underlying arthritis versus her chronic back issues versus anastrozole.  She will take 2-week break.  Based on her response and repeat bone density results we will decide on further treatment.  Options would be exemestane or tamoxifen.    3.  09/01/2020 left breast mammogram BI-RADS 1    4.  Hot flashes, no acute issues.  She is on gabapentin twice daily.    5.  Brittle nails, hair thinning and fatigue. normal TSH. On Biotin   Continue biotin, use scalp massages.  Hair thinning likely from endocrine therapy.     6.  With her personal history of breast cancer, discussed genetic counseling.  However she would not meet criteria for testing based on this alone.  Mother with history of lymphoma but otherwise no family history of  Malignancies.   Discussed option of meeting with genetic counselor.  She would like to defer at this point.    7.  Risk of lymphedema with lymph node dissection and radiation, she has no acute issues.  Continue to monitor.    8.   Bone health: No known history of osteoporosis.    Baseline BMD March 2022 at [facility] with least T score of -2.3 in the femur.  -1.8 in the lumbar spine.  We discussed management options. With intact uterus, previous prolonged tamoxifen use, she was not taking calcium and vitamin D supplementation ( She started ca/vit D after BMD March 2022.) shared decision made to continue anastrozole and repeat bone density in 1 year that is March 2023.    Limited weightbearing exercises due to back pain and left ankle pain.  Repeat bone density scheduled for end of March 2023.    Will review bone density results, discuss further management based on her response with treatment break from Anastrozole.    Return to clinic in 4 months or sooner as needed.  Parts of this note were created with voice recognition software. Please excuse any grammatical or typographical errors.

## 2021-04-20 ENCOUNTER — Encounter: Admit: 2021-04-20 | Discharge: 2021-04-20 | Payer: BC Managed Care – PPO

## 2021-04-20 DIAGNOSIS — C50911 Malignant neoplasm of unspecified site of right female breast: Secondary | ICD-10-CM

## 2021-04-20 MED ORDER — ALENDRONATE 70 MG PO TAB
70 mg | ORAL_TABLET | ORAL | 1 refills | Status: AC
Start: 2021-04-20 — End: ?

## 2021-04-20 NOTE — Progress Notes
Bone density March 2023: Progression with development of osteoporosis.  Left femur -2.9 T score.  Lumbar spine -2.4 compared to -1.8 in 2022.    Reviewed results with her.    We discussed management options.    Continue Anastrozole and initiate Fosamax.  Patient had completed a few dental procedures recently.  She believes she has filling of one of the teeth left.  She will discuss with her dentist and initiate Fosamax only after she obtains clearance from her dentist.  Side effects from Fosamax including but not limited to ONJ discussed with her in great detail.  All her questions and concerns were addressed.  Patient verbalized understanding and agreeable with the plan.

## 2021-05-30 ENCOUNTER — Encounter: Admit: 2021-05-30 | Discharge: 2021-05-30 | Payer: BC Managed Care – PPO

## 2021-05-30 MED ORDER — ANASTROZOLE 1 MG PO TAB
ORAL_TABLET | 1 refills
Start: 2021-05-30 — End: ?

## 2021-07-10 ENCOUNTER — Encounter: Admit: 2021-07-10 | Discharge: 2021-07-10 | Payer: BC Managed Care – PPO

## 2021-07-14 ENCOUNTER — Encounter: Admit: 2021-07-14 | Discharge: 2021-07-14 | Payer: BC Managed Care – PPO

## 2021-07-14 DIAGNOSIS — M810 Age-related osteoporosis without current pathological fracture: Secondary | ICD-10-CM

## 2021-07-14 DIAGNOSIS — C50919 Malignant neoplasm of unspecified site of unspecified female breast: Secondary | ICD-10-CM

## 2021-07-14 DIAGNOSIS — M2550 Pain in unspecified joint: Secondary | ICD-10-CM

## 2021-07-14 DIAGNOSIS — C50911 Malignant neoplasm of unspecified site of right female breast: Secondary | ICD-10-CM

## 2021-07-14 DIAGNOSIS — Z79811 Long term (current) use of aromatase inhibitors: Secondary | ICD-10-CM

## 2021-07-14 DIAGNOSIS — C801 Malignant (primary) neoplasm, unspecified: Secondary | ICD-10-CM

## 2021-07-14 NOTE — Progress Notes
Cbc  Name: Sarah Thornton          MRN: [id number]      DOB: [birth year]      AGE: 56 y.o.   DATE OF SERVICE: 07/14/2021    Subjective:             Reason for Visit:  Heme/Onc Care      Sarah Thornton is a 56 y.o. female.      History of Present Illness  REFERRING PROVIDER: Dr. Linward Foster  SURGEON: Dr. Yetta Barre  RADIATION ONCOLOGIST: Dr. Murvin Donning    DIAGNOSIS: Invasive ductal carcinoma grade 3, ER and PR positive, HER-2 negative  STAGE: pT3p N2a, stage IIIA  ONCOLOGICAL HISTORY:    Sarah Thornton is a pleasant 56 year old female who was diagnosed with right breast invasive ductal carcinoma 02/07/2014.  She presented due to a palpable right breast lump, imaging studies performed at [facility] showed 2.2 x 2.2 x 1.4 cm suspicious mass at 2 o'clock position.  Multiple cystic areas in the left breast 3:00 and 8 o'clock position were noted which were stable compared to the prior imaging studies.  She then underwent biopsy, pathology was consistent with invasive ductal carcinoma, ER 90%, PR 30% and HER-2 1+, 1 of the lymph nodes biopsied was positive.  She was evaluated by surgery and underwent modified radical mastectomy and lymph node dissection.  Surgical pathology showed invasive ductal carcinoma grade 3 measuring 5.7 x 4.6 x 2.5 cm, 4/9 lymph nodes were involved with extranodal extension, no DCIS.  She was treated by Dr. Rico Ala at [facility] her medical oncologist, she received dose dense AC followed by weekly Taxol ?12 weeks, she tolerated it extremely well.  She subsequently had postmastectomy radiation including regional lymphatics with 60 grey dose.  She was started on adjuvant endocrine therapy with tamoxifen in November 2016.  She stopped having menstrual cycles after first cycle of chemotherapy.  Has been amenorrheic since.  Patient has been tolerating tamoxifen overall well except for hot flashes.  She tells me that baseline CT scans were performed after surgery due to stage III disease, but I do not have that information available for review.  She established with me due to change in her insurance plan, and inability to follow with her previous medical oncologist.      TREATMENT HISTORY:   Right MRM with axillary lymph node dissection  Adjuvant chemotherapy with dose dense before [facility] and Taxol  Postmastectomy radiation  Tamoxifen November 2016 -November 2021  CURRENT THERAPY:   Anastrozole November 2021    INTERVAL HISTORY: Sarah Thornton is seen in follow-up.  Unaccompanied for today's visit.  Going back pain and bilateral hip pain which is impacting her quality of life.  Also impacting her emotionally.  She did not see a huge change with 2-week break.  Has completed her dental work-up and will be starting her Fosamax.  Ankle brace in place.  Based on response with supportive care today will be decision made regarding need for surgery.           Review of Systems   Constitutional: Negative for activity change, appetite change, chills, diaphoresis, fatigue and fever.             HENT: Negative.    Eyes: Negative.    Respiratory: Negative for cough and shortness of breath.    Cardiovascular: Negative for chest pain and palpitations.   Gastrointestinal: Negative for abdominal pain, blood in stool, constipation, diarrhea, nausea  and vomiting.   Genitourinary: Negative.    Musculoskeletal: Positive for arthralgias and back pain.   Skin: Negative for rash.   Neurological: Negative for dizziness, weakness, light-headedness, numbness and headaches.   Hematological: Negative.    Psychiatric/Behavioral: Negative.            PAST MEDICAL HISTORY: breast cancer  PAST SURGICAL HISTORY:  Mastectomy, lymph node dissection, colonoscopy  FAMILY HISTORY: Reviewed and is noncontributory.  Mother with history of lymphoma.  No other family history of malignancies.  OB/GYN HISTORY:  Age at Menarche: 9  G2P2  LMP: age  80  HRT use: None  Breastfeeding: No  SOCIAL HISTORY: Patient is married. Lives with her husband.  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  Former smoker, quit in 2015, 5-pack-year history, no alcohol use, no illicit drug use.      Objective:         ? alendronate (FOSAMAX) 70 mg tablet Take one tablet by mouth every 7 days. Take at least 30 minutes before breakfast with plain water. Do not lie down for 30 minutes.   ? anastrozole (ARIMIDEX) 1 mg tablet TAKE 1 TABLET BY MOUTH DAILY   ? cyclobenzaprine (FLEXERIL) 10 mg tablet Take one tablet by mouth three times daily as needed for Muscle Cramps.   ? gabapentin (NEURONTIN) 300 mg capsule TAKE 1 CAPSULE BY MOUTH DAILY AT BEDTIME     Vitals:    07/14/21 1059   BP: 128/38   BP Source: Arm, Left Upper   Pulse: 72   Temp: 36.7 ?C (98 ?F)   Resp: 18   SpO2: 100%   TempSrc: Oral   PainSc: Seven   Weight: 58.8 kg (129 lb 9.6 oz)     Body mass index is 23.25 kg/m?Marland Kitchen     Pain Score: Seven  Pain Loc: Generalized      Pain Addressed:  N/A    Patient Evaluated for a Clinical Trial: No treatment clinical trial available for this patient.     [place] Cooperative Oncology Group performance status is 1, Restricted in physically strenuous activity but ambulatory and able to carry out work of a light or sedentary nature, e.g., light house work, office work.     Physical Exam  Constitutional:       General: She is not in acute distress.  HENT:      Head: Normocephalic.   Eyes:      Extraocular Movements: Extraocular movements intact.      Conjunctiva/sclera: Conjunctivae normal.   Cardiovascular:      Rate and Rhythm: Normal rate and regular rhythm.   Musculoskeletal:         General: Tenderness (bilateral hip joint tenderness) present.      Right lower leg: No edema.      Left lower leg: No edema.   Neurological:      Mental Status: She is alert and oriented to person, place, and time.   Psychiatric:         Mood and Affect: Mood normal.         Thought Content: Thought content normal.         Judgment: Judgment normal.               Assessment and Plan:  1. History of right breast invasive ductal carcinoma grade 3 ER and PR positive, HER-2 negative.  Tumor measuring more than 5 cm with 4 lymph nodes positive pT3 pN2A, stage IIIa status post mastectomy and lymph node  dissection  She has received adjuvant chemotherapy and radiation.    2. On adjuvant endocrine therapy since November 2016.    Tamoxifen Nov 2016 - Nov 2021.  Based on her tumor characteristics, prolonged endocrine therapy recommended.  Plan for additional 2 to 5 years based on tolerability.  On Anastrozole since July 2021.    Bilateral hip pain.  Could be related to underlying arthritis versus her chronic back issues versus anastrozole.  Discussed further options, plan is for a longer 6-week break.  Based on response will decide on continuing endocrine therapy, switch to exemestane or tamoxifen versus complete discontinuation.  Does not remember having significant change in symptoms with tamoxifen.    3.  09/01/2020 left breast mammogram BI-RADS 1    4.  Hot flashes, no acute issues.  She is on gabapentin twice daily.    5.  Brittle nails, hair thinning and fatigue. normal TSH. On Biotin   Continue biotin, use scalp massages.  Hair thinning likely from endocrine therapy.     6.  With her personal history of breast cancer, discussed genetic counseling.  However she would not meet criteria for testing based on this alone.  Mother with history of lymphoma but otherwise no family history of  Malignancies.   Discussed option of meeting with genetic counselor.  She would like to defer at this point.    7.  Risk of lymphedema with lymph node dissection and radiation, she has no acute issues.  Continue to monitor.    8.   Bone health:   Baseline BMD March 2022 at [facility] with least T score of -2.3 in the femur.  -1.8 in the lumbar spine.  With intact uterus, previous prolonged tamoxifen use, she was not taking calcium and vitamin D supplementation ( She started ca/vit D after BMD March 2022.) shared decision made to continue anastrozole and repeat bone density in 1 year that is March 2023.    Bone density March 2023: Progression with development of osteoporosis.  Left femur -2.9 T score.  Lumbar spine -2.4 compared to -1.8 in 2022.  Initiated Fosamax.  Was unable to start secondary to dental procedures.  She is going to start the medication now.    RTC in 6 weeks   Parts of this note were created with voice recognition software. Please excuse any grammatical or typographical errors.

## 2021-09-15 ENCOUNTER — Encounter: Admit: 2021-09-15 | Discharge: 2021-09-15 | Payer: BC Managed Care – PPO

## 2021-10-05 ENCOUNTER — Encounter: Admit: 2021-10-05 | Discharge: 2021-10-05 | Payer: BC Managed Care – PPO

## 2021-10-05 MED ORDER — ALENDRONATE 70 MG PO TAB
70 mg | ORAL_TABLET | ORAL | 1 refills
Start: 2021-10-05 — End: ?

## 2021-11-29 ENCOUNTER — Encounter: Admit: 2021-11-29 | Discharge: 2021-11-29 | Payer: BC Managed Care – PPO

## 2021-11-29 MED ORDER — ANASTROZOLE 1 MG PO TAB
ORAL_TABLET | 1 refills
Start: 2021-11-29 — End: ?

## 2021-12-01 ENCOUNTER — Encounter: Admit: 2021-12-01 | Discharge: 2021-12-01 | Payer: BC Managed Care – PPO

## 2021-12-01 MED ORDER — ALENDRONATE 70 MG PO TAB
70 mg | ORAL_TABLET | ORAL | 1 refills
Start: 2021-12-01 — End: ?

## 2022-03-16 ENCOUNTER — Encounter: Admit: 2022-03-16 | Discharge: 2022-03-16 | Payer: BC Managed Care – PPO

## 2022-03-16 DIAGNOSIS — M810 Age-related osteoporosis without current pathological fracture: Secondary | ICD-10-CM

## 2022-03-16 DIAGNOSIS — Z08 Encounter for follow-up examination after completed treatment for malignant neoplasm: Secondary | ICD-10-CM

## 2022-03-16 DIAGNOSIS — C50911 Malignant neoplasm of unspecified site of right female breast: Secondary | ICD-10-CM

## 2022-03-16 DIAGNOSIS — C50919 Malignant neoplasm of unspecified site of unspecified female breast: Secondary | ICD-10-CM

## 2022-03-16 DIAGNOSIS — M2550 Pain in unspecified joint: Secondary | ICD-10-CM

## 2022-03-16 DIAGNOSIS — Z79811 Long term (current) use of aromatase inhibitors: Secondary | ICD-10-CM

## 2022-03-16 DIAGNOSIS — C801 Malignant (primary) neoplasm, unspecified: Secondary | ICD-10-CM

## 2022-03-16 MED ORDER — ANASTROZOLE 1 MG PO TAB
1 mg | ORAL_TABLET | Freq: Every day | ORAL | 3 refills | 33.00000 days | Status: AC
Start: 2022-03-16 — End: ?

## 2022-03-16 MED ORDER — ALENDRONATE 70 MG PO TAB
70 mg | ORAL_TABLET | ORAL | 3 refills | Status: AC
Start: 2022-03-16 — End: ?

## 2022-03-16 NOTE — Progress Notes
Cbc  Name: Sarah Thornton          MRN: [id number]      DOB: 1965/08/12      AGE: 57 y.o.   DATE OF SERVICE: 03/16/2022    Subjective:             Reason for Visit:  Heme/Onc Care      Sarah Thornton is a 57 y.o. female.      History of Present Illness  REFERRING PROVIDER: Dr. Linward Foster  SURGEON: Dr. Yetta Barre  RADIATION ONCOLOGIST: Dr. Murvin Donning    DIAGNOSIS: Invasive ductal carcinoma grade 3, ER and PR positive, HER-2 negative  STAGE: pT3p N2a, stage IIIA  ONCOLOGICAL HISTORY:    Ms. Sarah is a pleasant 57 year old female who was diagnosed with right breast invasive ductal carcinoma 02/07/2014.  She presented due to a palpable right breast lump, imaging studies performed at [facility] showed 2.2 x 2.2 x 1.4 cm suspicious mass at 2 o'clock position.  Multiple cystic areas in the left breast 3:00 and 8 o'clock position were noted which were stable compared to the prior imaging studies.  She then underwent biopsy, pathology was consistent with invasive ductal carcinoma, ER 90%, PR 30% and HER-2 1+, 1 of the lymph nodes biopsied was positive.  She was evaluated by surgery and underwent modified radical mastectomy and lymph node dissection.  Surgical pathology showed invasive ductal carcinoma grade 3 measuring 5.7 x 4.6 x 2.5 cm, 4/9 lymph nodes were involved with extranodal extension, no DCIS.  She was treated by Dr. Rico Ala at [facility] her medical oncologist, she received dose dense AC followed by weekly Taxol ?12 weeks, she tolerated it extremely well.  She subsequently had postmastectomy radiation including regional lymphatics with 60 grey dose.  She was started on adjuvant endocrine therapy with tamoxifen in November 2016.  She stopped having menstrual cycles after first cycle of chemotherapy.  Has been amenorrheic since.  Patient has been tolerating tamoxifen overall well except for hot flashes.  She tells me that baseline CT scans were performed after surgery due to stage III disease, but I do not have that information available for review.  She established with me due to change in her insurance plan, and inability to follow with her previous medical oncologist.      TREATMENT HISTORY:   Right MRM with axillary lymph node dissection  Adjuvant chemotherapy with dose dense before [facility] and Taxol  Postmastectomy radiation  Tamoxifen November 2016 -November 2021  CURRENT THERAPY:   Anastrozole November 2021    INTERVAL HISTORY: Thornton is seen in follow-up.   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  He has done overall well with resuming anastrozole.  They are working on getting her pain stimulator.    She is noticing palpitations with exertion.  No chest pains, dyspnea or other complaints.  She is using her watch to monitor her heart rate.  Is wondering if it is related to anastrozole.            Review of Systems   Constitutional:  Negative for activity change, appetite change, chills, diaphoresis, fatigue and fever.             HENT: Negative.     Eyes: Negative.    Respiratory:  Negative for cough and shortness of breath.    Cardiovascular:  Negative for chest pain and palpitations.   Gastrointestinal:  Negative for abdominal pain, blood in  stool, constipation, diarrhea, nausea and vomiting.   Genitourinary: Negative.    Musculoskeletal:  Positive for arthralgias and back pain.   Skin:  Negative for rash.   Neurological:  Negative for dizziness, weakness, light-headedness, numbness and headaches.   Hematological: Negative.    Psychiatric/Behavioral: Negative.             PAST MEDICAL HISTORY: breast cancer  PAST SURGICAL HISTORY:  Mastectomy, lymph node dissection, colonoscopy  FAMILY HISTORY: Reviewed and is noncontributory.  Mother with history of lymphoma.  No other family history of malignancies.  OB/GYN HISTORY:  Age at Menarche: 41  G2P2  LMP: age  19  HRT use: None  Breastfeeding: No  SOCIAL HISTORY: Patient is married. Lives with her husband.  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  Former smoker, quit in 2015, 5-pack-year history, no alcohol use, no illicit drug use.      Objective:          alendronate (FOSAMAX) 70 mg tablet TAKE 1 TABLET BY MOUTH EVERY 7 DAYS. TAKE AT LEAST 30 MINUTES BEFORE BREAKFAST WITH PLAIN WATER. DO NOT LIE DOWN FOR 30 MINUTES    anastrozole (ARIMIDEX) 1 mg tablet TAKE 1 TABLET BY MOUTH DAILY    cyclobenzaprine (FLEXERIL) 10 mg tablet Take one tablet by mouth three times daily as needed for Muscle Cramps.    gabapentin (NEURONTIN) 300 mg capsule TAKE 1 CAPSULE BY MOUTH DAILY AT BEDTIME     Vitals:    03/16/22 1502 03/16/22 1503   BP: 120/41    BP Source: Arm, Left Upper    Pulse: 96    Temp: 37 ?C (98.6 ?F)    Resp: 16    SpO2: 100%    TempSrc: Oral    PainSc: Zero Zero   Weight: 62.3 kg (137 lb 6.4 oz)    Height: 159 cm (5' 2.6)      Body mass index is 24.65 kg/m?Marland Kitchen     Pain Score: Zero         Pain Addressed:  N/A    Patient Evaluated for a Clinical Trial: No treatment clinical trial available for this patient.     [place] Cooperative Oncology Group performance status is 1, Restricted in physically strenuous activity but ambulatory and able to carry out work of a light or sedentary nature, e.g., light house work, office work.     Physical Exam  Constitutional:       General: She is not in acute distress.  HENT:      Head: Normocephalic.   Eyes:      Extraocular Movements: Extraocular movements intact.      Conjunctiva/sclera: Conjunctivae normal.   Cardiovascular:      Rate and Rhythm: Normal rate and regular rhythm.   Pulmonary:      Effort: Pulmonary effort is normal.      Breath sounds: Normal breath sounds.   Chest:       Neurological:      Mental Status: She is alert.   Psychiatric:         Mood and Affect: Mood normal.         Thought Content: Thought content normal.         Judgment: Judgment normal.               Assessment and Plan:  1.  History of right breast invasive ductal carcinoma grade 3 ER and PR positive, HER-2 negative.  Tumor measuring more than 5 cm with 4 lymph  nodes positive pT3 pN2A, stage IIIa status post mastectomy and lymph node dissection  She has received adjuvant chemotherapy and radiation.    2. On adjuvant endocrine therapy since November 2016.    Tamoxifen Nov 2016 - Nov 2021.  Based on her tumor characteristics, prolonged endocrine therapy recommended.  Plan for additional 2 to 5 years based on tolerability.    On Anastrozole since July 2021.    No significant improvement with prolonged treatment break for 6 weeks.  Her symptoms related to the radiculopathy rather than anastrozole.  It was recently informed that she may have had injury secondary to her epidural injection.  Plan is for a stimulator placement.  Orah has restarted anastrozole.    3.  Recommended annual mammogram for the left breast.    4.  Hot flashes, no acute issues.  She is on gabapentin twice daily.    5.  Brittle nails, hair thinning and fatigue. normal TSH. On Biotin   Continue biotin, use scalp massages.  Hair thinning likely from endocrine therapy.     6.  With her personal history of breast cancer, discussed genetic counseling.  However she would not meet criteria for testing based on this alone.  Mother with history of lymphoma but otherwise no family history of  Malignancies.   Discussed option of meeting with genetic counselor.  She would like to defer at this point.    7.  Risk of lymphedema with lymph node dissection and adjuvant radiation, she has no acute issues.  Continue to monitor.    8.   Bone health:   Baseline BMD March 2022 at [facility] with least T score of -2.3 in the femur.  -1.8 in the lumbar spine.  With intact uterus, previous prolonged tamoxifen use, she was not taking calcium and vitamin D supplementation ( She started ca/vit D after BMD March 2022.) shared decision made to continue Anastrozole and repeat bone density in 1 year that is March 2023.    Bone density March 2023: Progression with development of osteoporosis.  Left femur -2.9 T score.  Lumbar spine -2.4 compared to -1.8 in 2022.  Initiated Fosamax June 2023, tolerating well.  Delay secondary to dental procedures.  Continue for another year.  Repeat bone density testing in March 2025.    9.  Palpitations: With exertion.  No other associated symptoms.  Heart rate regular and normal at visit today.  I have reassured the patient, likely unrelated to anastrozole.  Keep monitoring it on her smart watch and follow-up with her primary care provider.      RTC in 1 year or sooner as needed.    Parts of this note were created with voice recognition software. Please excuse any grammatical or typographical errors.

## 2022-05-07 ENCOUNTER — Encounter: Admit: 2022-05-07 | Discharge: 2022-05-07 | Payer: BC Managed Care – PPO

## 2022-05-07 MED ORDER — ALENDRONATE 70 MG PO TAB
70 mg | ORAL_TABLET | ORAL | 3 refills | Status: AC
Start: 2022-05-07 — End: ?

## 2022-11-22 ENCOUNTER — Encounter: Admit: 2022-11-22 | Discharge: 2022-11-22 | Payer: BC Managed Care – PPO

## 2022-11-22 MED ORDER — ANASTROZOLE 1 MG PO TAB
1 mg | ORAL_TABLET | Freq: Every day | ORAL | 3 refills | 33.00000 days | Status: AC
Start: 2022-11-22 — End: ?

## 2023-04-13 ENCOUNTER — Encounter: Admit: 2023-04-13 | Discharge: 2023-04-13 | Payer: BC Managed Care – PPO

## 2023-04-14 ENCOUNTER — Encounter: Admit: 2023-04-14 | Discharge: 2023-04-14 | Payer: BC Managed Care – PPO

## 2023-04-14 DIAGNOSIS — C50911 Malignant neoplasm of unspecified site of right female breast: Secondary | ICD-10-CM

## 2023-04-14 DIAGNOSIS — Z08 Encounter for follow-up examination after completed treatment for malignant neoplasm: Secondary | ICD-10-CM

## 2023-04-14 DIAGNOSIS — M810 Age-related osteoporosis without current pathological fracture: Secondary | ICD-10-CM

## 2023-04-14 DIAGNOSIS — Z79811 Long term (current) use of aromatase inhibitors: Secondary | ICD-10-CM

## 2023-04-14 NOTE — Progress Notes
 [facility]  Name: Sarah Thornton          MRN: [id number]      DOB: 10/12/1965      AGE: 58 y.o.   DATE OF SERVICE: 04/14/2023    Subjective:             Reason for Visit:  Heme/Onc Care      Sarah Thornton is a 58 y.o. female.      History of Present Illness  REFERRING PROVIDER: Dr. Linward Foster  SURGEON: Dr. Yetta Barre  RADIATION ONCOLOGIST: Dr. Murvin Donning    DIAGNOSIS: Invasive ductal carcinoma grade 3, ER and PR positive, HER-2 negative  STAGE: pT3p N2a, stage IIIA  ONCOLOGICAL HISTORY:    Sarah Thornton is a pleasant 58 year old female who was diagnosed with right breast invasive ductal carcinoma 02/07/2014.  She presented due to a palpable right breast lump, imaging studies performed at [facility] showed 2.2 x 2.2 x 1.4 cm suspicious mass at 2 o'clock position.  Multiple cystic areas in the left breast 3:00 and 8 o'clock position were noted which were stable compared to the prior imaging studies.  She then underwent biopsy, pathology was consistent with invasive ductal carcinoma, ER 90%, PR 30% and HER-2 1+, 1 of the lymph nodes biopsied was positive.  She was evaluated by surgery and underwent modified radical mastectomy and lymph node dissection.  Surgical pathology showed invasive ductal carcinoma grade 3 measuring 5.7 x 4.6 x 2.5 cm, 4/9 lymph nodes were involved with extranodal extension, no DCIS.  She was treated by Dr. Rico Ala at [facility] her medical oncologist, she received dose dense AC followed by weekly Taxol ?12 weeks, she tolerated it extremely well.  She subsequently had postmastectomy radiation including regional lymphatics with 60 grey dose.  She was started on adjuvant endocrine therapy with tamoxifen in November 2016.  She stopped having menstrual cycles after first cycle of chemotherapy.  Has been amenorrheic since.  Patient has been tolerating tamoxifen overall well except for hot flashes.  She tells me that baseline CT scans were performed after surgery due to stage III disease, but I do not have that information available for review.  She established with me due to change in her insurance plan, and inability to follow with her previous medical oncologist.      TREATMENT HISTORY:   Right MRM with axillary lymph node dissection  Adjuvant chemotherapy with dose dense before [facility] and Taxol  Postmastectomy radiation  Tamoxifen November 2016 -November 2021  CURRENT THERAPY:   Anastrozole November 2021    INTERVAL HISTORY: Sarah Thornton is seen in follow-up.   Unaccompanied for today's visit  During PT after back surgery, she had tear in the left shoulder and required surgery.  Back stimulator had to removed as it caused difficulty with sensation in lower part of the body.           Review of Systems   Constitutional:  Negative for activity change, appetite change, chills, diaphoresis, fatigue and fever.             HENT: Negative.     Eyes: Negative.    Respiratory:  Negative for cough and shortness of breath.    Cardiovascular:  Negative for chest pain and palpitations.   Gastrointestinal:  Negative for abdominal pain, blood in stool, constipation, diarrhea, nausea and vomiting.   Genitourinary: Negative.    Musculoskeletal:  Positive for arthralgias and back pain.   Skin:  Negative for rash.  Neurological:  Negative for dizziness, weakness, light-headedness, numbness and headaches.   Hematological: Negative.    Psychiatric/Behavioral: Negative.             PAST MEDICAL HISTORY: breast cancer  PAST SURGICAL HISTORY:  Mastectomy, lymph node dissection, colonoscopy  FAMILY HISTORY: Reviewed and is noncontributory.  Mother with history of lymphoma.  No other family history of malignancies.  OB/GYN HISTORY:  Age at Menarche: 49  G2P2  LMP: age  75  HRT use: None  Breastfeeding: No  SOCIAL HISTORY: Patient is married.  Lives with her husband.  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  Former smoker, quit in 2015, 5-pack-year history, no alcohol use, no illicit drug use.      Objective:          alendronate (FOSAMAX) 70 mg tablet Take one tablet by mouth every 7 days. TAKE 30 MINUTES BEFORE BREAKFAST WITH PLAIN WATER DO NOT LIE DOWN FOR 30 MIN    anastrozole (ARIMIDEX) 1 mg tablet TAKE 1 TABLET BY MOUTH DAILY    cyclobenzaprine (FLEXERIL) 10 mg tablet Take one tablet by mouth three times daily as needed for Muscle Cramps. (Patient not taking: Reported on 04/14/2023)    gabapentin (NEURONTIN) 300 mg capsule TAKE 1 CAPSULE BY MOUTH DAILY AT BEDTIME (Patient not taking: Reported on 04/14/2023)     Vitals:    04/14/23 0917   BP: (!) 140/75   BP Source: Arm, Left Upper   Pulse: 70   Temp: 36.2 ?C (97.2 ?F)   Resp: 18   SpO2: 100%   TempSrc: Temporal   PainSc: Zero   Weight: 59.4 kg (131 lb)     Body mass index is 23.5 kg/m?Marland Kitchen     Pain Score: Zero         Pain Addressed:  N/A    Patient Evaluated for a Clinical Trial: No treatment clinical trial available for this patient.     [place] Cooperative Oncology Group performance status is 1, Restricted in physically strenuous activity but ambulatory and able to carry out work of a light or sedentary nature, e.g., light house work, office work.     Physical Exam  Constitutional:       General: She is not in acute distress.  HENT:      Head: Normocephalic.   Eyes:      Extraocular Movements: Extraocular movements intact.      Conjunctiva/sclera: Conjunctivae normal.   Cardiovascular:      Rate and Rhythm: Normal rate and regular rhythm.   Pulmonary:      Effort: Pulmonary effort is normal.      Breath sounds: Normal breath sounds.   Chest:       Neurological:      Mental Status: She is alert.   Psychiatric:         Mood and Affect: Mood normal.         Thought Content: Thought content normal.         Judgment: Judgment normal.               Assessment and Plan:  1.  History of right breast invasive ductal carcinoma grade 3 ER and PR positive, HER-2 negative.  Tumor measuring more than 5 cm with 4 lymph nodes positive pT3 pN2A, stage IIIa status post mastectomy and lymph node dissection  She has received adjuvant chemotherapy and radiation.    2. On adjuvant endocrine therapy since November 2016.    Tamoxifen Nov 2016 -  Nov 2021.  Based on her tumor characteristics, prolonged endocrine therapy recommended.  Plan for additional 2 to 5 years based on tolerability.    On Anastrozole since July 2021.    No significant improvement with prolonged treatment break for 6 weeks.  Her symptoms related to the radiculopathy rather than anastrozole.  She was informed that she may have had injury secondary to her epidural injection.  Stimulator placement was tried but she had a lack of sensation in her lower part of the body and so they had to take it out.  She is on anastrozole and overall feels she is tolerating it well.    3.  Recommended annual mammogram for the left breast. She said it was completed Aug 2024 at [facility] and was normal. Continue annual imaging.  Will request results    4.  Hot flashes, no acute issues.  She is on gabapentin twice daily.    5.  Brittle nails, hair thinning and fatigue. normal TSH. On Biotin   Continue biotin, use scalp massages.  Hair thinning likely from endocrine therapy.     6.  With her personal history of breast cancer, discussed genetic counseling.  However she would not meet criteria for testing based on this alone.  Mother with history of lymphoma but otherwise no family history of  Malignancies.   Discussed option of meeting with genetic counselor.  She was not ready to pursue testing, she will call us if she changes her mind.    7.  Risk of lymphedema with lymph node dissection and adjuvant radiation, she has no acute issues.  Continue to monitor.    8.   Bone health:   Baseline BMD March 2022 at [facility] with least T score of -2.3 in the femur.  -1.8 in the lumbar spine.  With intact uterus, previous prolonged tamoxifen use, she was not taking calcium and vitamin D supplementation ( She started ca/vit D after BMD March 2022.) shared decision made to continue Anastrozole and repeat bone density in 1 year that is March 2023.    Bone density March 2023: Progression with development of osteoporosis.  Left femur -2.9 T score.  Lumbar spine -2.4 compared to -1.8 in 2022.  Initiated Fosamax June 2023, tolerating well.  Delay secondary to dental procedures.    Repeat density was due March 2025, she will call and get this scheduled.  Further management based on these results.  Will call her once results are available      RTC in 1 year or sooner as needed.    Parts of this note were created with voice recognition software. Please excuse any grammatical or typographical errors.

## 2023-04-22 ENCOUNTER — Encounter: Admit: 2023-04-22 | Discharge: 2023-04-22 | Payer: BC Managed Care – PPO

## 2023-04-22 MED ORDER — ALENDRONATE 70 MG PO TAB
ORAL_TABLET | 3 refills | Status: AC
Start: 2023-04-22 — End: ?

## 2023-07-12 ENCOUNTER — Encounter: Admit: 2023-07-12 | Discharge: 2023-07-12 | Payer: BLUE CROSS/BLUE SHIELD

## 2023-07-12 MED ORDER — ANASTROZOLE 1 MG PO TAB
1 mg | ORAL_TABLET | Freq: Every day | ORAL | 3 refills | 33.00000 days | Status: AC
Start: 2023-07-12 — End: ?

## 2023-09-13 ENCOUNTER — Encounter: Admit: 2023-09-13 | Discharge: 2023-09-13 | Payer: BLUE CROSS/BLUE SHIELD

## 2023-09-13 DIAGNOSIS — Z08 Encounter for follow-up examination after completed treatment for malignant neoplasm: Secondary | ICD-10-CM

## 2023-09-13 DIAGNOSIS — M810 Age-related osteoporosis without current pathological fracture: Secondary | ICD-10-CM

## 2023-09-13 DIAGNOSIS — Z79811 Long term (current) use of aromatase inhibitors: Secondary | ICD-10-CM

## 2023-09-13 DIAGNOSIS — C50911 Malignant neoplasm of unspecified site of right female breast: Principal | ICD-10-CM

## 2023-10-17 ENCOUNTER — Encounter: Admit: 2023-10-17 | Discharge: 2023-10-17 | Payer: BLUE CROSS/BLUE SHIELD

## 2023-10-17 DIAGNOSIS — C50911 Malignant neoplasm of unspecified site of right female breast: Principal | ICD-10-CM

## 2023-10-20 ENCOUNTER — Encounter: Admit: 2023-10-20 | Discharge: 2023-10-20 | Payer: BLUE CROSS/BLUE SHIELD

## 2023-10-20 DIAGNOSIS — C50911 Malignant neoplasm of unspecified site of right female breast: Principal | ICD-10-CM

## 2023-10-20 DIAGNOSIS — Z08 Encounter for follow-up examination after completed treatment for malignant neoplasm: Secondary | ICD-10-CM

## 2023-10-21 ENCOUNTER — Encounter: Admit: 2023-10-21 | Discharge: 2023-10-21 | Payer: BLUE CROSS/BLUE SHIELD

## 2023-10-21 DIAGNOSIS — Z08 Encounter for follow-up examination after completed treatment for malignant neoplasm: Secondary | ICD-10-CM

## 2023-10-21 DIAGNOSIS — C50911 Malignant neoplasm of unspecified site of right female breast: Principal | ICD-10-CM

## 2023-10-24 ENCOUNTER — Encounter: Admit: 2023-10-24 | Discharge: 2023-10-24 | Payer: BLUE CROSS/BLUE SHIELD

## 2023-10-25 ENCOUNTER — Encounter: Admit: 2023-10-25 | Discharge: 2023-10-25 | Payer: BLUE CROSS/BLUE SHIELD

## 2023-10-25 DIAGNOSIS — Z08 Encounter for follow-up examination after completed treatment for malignant neoplasm: Secondary | ICD-10-CM

## 2023-10-25 DIAGNOSIS — C50911 Malignant neoplasm of unspecified site of right female breast: Principal | ICD-10-CM

## 2024-02-10 ENCOUNTER — Encounter: Admit: 2024-02-10 | Discharge: 2024-02-10 | Payer: BLUE CROSS/BLUE SHIELD
